# Patient Record
Sex: Female | Born: 1950 | Race: White | Hispanic: No | Smoking: Never smoker
Health system: Southern US, Community
[De-identification: ages and names within clinical notes are randomized; demographics above are authoritative.]

## PROBLEM LIST (undated history)

## (undated) DIAGNOSIS — I10 Essential (primary) hypertension: Secondary | ICD-10-CM

## (undated) DIAGNOSIS — T7840XA Allergy, unspecified, initial encounter: Secondary | ICD-10-CM

## (undated) DIAGNOSIS — F419 Anxiety disorder, unspecified: Secondary | ICD-10-CM

## (undated) HISTORY — DX: Anxiety disorder, unspecified: F41.9

## (undated) HISTORY — DX: Essential (primary) hypertension: I10

---

## 1998-10-21 HISTORY — PX: OTHER SURGICAL HISTORY: SHX169

## 1998-10-21 HISTORY — PX: TOTAL ABDOMINAL HYSTERECTOMY: SHX209

## 2006-08-19 ENCOUNTER — Ambulatory Visit: Payer: Self-pay

## 2007-02-04 ENCOUNTER — Ambulatory Visit: Payer: Self-pay | Admitting: Gastroenterology

## 2007-06-18 ENCOUNTER — Encounter: Payer: Self-pay | Admitting: Urology

## 2007-06-22 ENCOUNTER — Encounter: Payer: Self-pay | Admitting: Urology

## 2007-07-22 ENCOUNTER — Encounter: Payer: Self-pay | Admitting: Urology

## 2007-08-22 ENCOUNTER — Encounter: Payer: Self-pay | Admitting: Urology

## 2008-05-24 ENCOUNTER — Ambulatory Visit: Payer: Self-pay | Admitting: Internal Medicine

## 2008-08-31 ENCOUNTER — Ambulatory Visit: Payer: Self-pay | Admitting: Internal Medicine

## 2010-06-26 ENCOUNTER — Ambulatory Visit: Payer: Self-pay | Admitting: Internal Medicine

## 2010-06-28 ENCOUNTER — Ambulatory Visit: Payer: Self-pay | Admitting: Internal Medicine

## 2014-06-03 DIAGNOSIS — F411 Generalized anxiety disorder: Secondary | ICD-10-CM | POA: Insufficient documentation

## 2014-06-03 DIAGNOSIS — I10 Essential (primary) hypertension: Secondary | ICD-10-CM | POA: Insufficient documentation

## 2016-07-25 ENCOUNTER — Other Ambulatory Visit: Payer: Self-pay | Admitting: Family Medicine

## 2016-07-25 DIAGNOSIS — Z1231 Encounter for screening mammogram for malignant neoplasm of breast: Secondary | ICD-10-CM

## 2016-08-22 ENCOUNTER — Ambulatory Visit
Admission: RE | Admit: 2016-08-22 | Discharge: 2016-08-22 | Disposition: A | Discharge status: Home / Self Care | Payer: BC Managed Care – PPO | Source: Ambulatory Visit | Attending: Family Medicine | Admitting: Family Medicine

## 2016-08-22 DIAGNOSIS — Z1231 Encounter for screening mammogram for malignant neoplasm of breast: Secondary | ICD-10-CM | POA: Diagnosis present

## 2017-10-03 ENCOUNTER — Other Ambulatory Visit: Payer: Self-pay | Admitting: Family Medicine

## 2019-04-13 ENCOUNTER — Ambulatory Visit (INDEPENDENT_AMBULATORY_CARE_PROVIDER_SITE_OTHER): Payer: BC Managed Care – PPO

## 2019-04-13 ENCOUNTER — Other Ambulatory Visit: Payer: Self-pay

## 2019-04-13 ENCOUNTER — Ambulatory Visit: Payer: BC Managed Care – PPO | Admitting: Podiatry

## 2019-04-13 ENCOUNTER — Other Ambulatory Visit: Payer: Self-pay | Admitting: Podiatry

## 2019-04-13 ENCOUNTER — Encounter: Payer: Self-pay | Admitting: Podiatry

## 2019-04-13 VITALS — Temp 98.3°F

## 2019-04-13 DIAGNOSIS — M21619 Bunion of unspecified foot: Secondary | ICD-10-CM | POA: Diagnosis not present

## 2019-04-13 DIAGNOSIS — M21612 Bunion of left foot: Secondary | ICD-10-CM

## 2019-04-13 DIAGNOSIS — M21611 Bunion of right foot: Secondary | ICD-10-CM

## 2019-04-13 DIAGNOSIS — L84 Corns and callosities: Secondary | ICD-10-CM | POA: Diagnosis not present

## 2019-04-13 DIAGNOSIS — M779 Enthesopathy, unspecified: Secondary | ICD-10-CM

## 2019-04-15 NOTE — Progress Notes (Signed)
   Subjective: 68 year old female presenting today as a new patient with a chief complaint of bunions noted to the feet bilaterally that have been present for the past few years. She reports some discomfort from the bunions but denies any significant pain caused by them. She also reports nonpainful nodules located on the plantar aspects of the bilateral feet as well as painful callus lesions. She has not done anything to treat the symptoms. Walking and bearing weight increases the pain. Patient is here for further evaluation and treatment.  No past medical history on file.  Objective: Physical Exam General: The patient is alert and oriented x3 in no acute distress.  Dermatology: Hyperkeratotic lesions present on the bilateral feet. Pain on palpation with a central nucleated core noted. Skin is cool, dry and supple bilateral lower extremities. Negative for open lesions or macerations.  Vascular: Palpable pedal pulses bilaterally. No edema or erythema noted. Capillary refill within normal limits.  Neurological: Epicritic and protective threshold grossly intact bilaterally.   Musculoskeletal Exam: Clinical evidence of bunion deformity noted to the respective foot. There is moderate pain on palpation range of motion of the first MPJ. Lateral deviation of the hallux noted consistent with hallux abductovalgus. Palpable nodules noted to the plantar medial longitudinal arch of the bilateral feet.  Radiographic Exam: Increased intermetatarsal angle greater than 15 with a hallux abductus angle greater than 30 noted on AP view. Moderate degenerative changes noted within the first MPJ.  Assessment: 1. 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 Bilateral plantar fibromas - asymptomatic  3. Symptomatic callus lesions noted to bilateral feet x 4    Plan of Care:  1. Patient was evaluated. X-Rays reviewed. 2. Continue wearing good shoe gear.  3. Recommended good foot hygiene.  4.  Recommended OTC Motrin as needed.  5. Excisional debridement of keratotic lesion using a chisel blade was performed without incident. Light dressing applied.  6. Return to clinic as needed.    Edrick Kins, DPM [facility]  Dr. Edrick Kins, [address]                                        [place], [place] [address]                Office [phone]  Fax [phone]

## 2019-07-30 LAB — HM COLONOSCOPY

## 2021-09-06 ENCOUNTER — Other Ambulatory Visit: Payer: Self-pay | Admitting: Family Medicine

## 2021-09-06 DIAGNOSIS — Z1231 Encounter for screening mammogram for malignant neoplasm of breast: Secondary | ICD-10-CM

## 2021-12-03 ENCOUNTER — Ambulatory Visit: Payer: BC Managed Care – PPO

## 2022-02-07 ENCOUNTER — Ambulatory Visit: Payer: Self-pay

## 2022-03-19 ENCOUNTER — Ambulatory Visit
Admission: RE | Admit: 2022-03-19 | Discharge: 2022-03-19 | Disposition: A | Discharge status: Home / Self Care | Payer: Medicare PPO | Source: Ambulatory Visit | Attending: Family Medicine | Admitting: Family Medicine

## 2022-03-19 DIAGNOSIS — Z1231 Encounter for screening mammogram for malignant neoplasm of breast: Secondary | ICD-10-CM | POA: Diagnosis present

## 2022-10-04 ENCOUNTER — Other Ambulatory Visit: Payer: Self-pay | Admitting: Pulmonary Disease

## 2022-10-04 DIAGNOSIS — R0602 Shortness of breath: Secondary | ICD-10-CM

## 2022-10-04 DIAGNOSIS — R059 Cough, unspecified: Secondary | ICD-10-CM

## 2022-10-04 DIAGNOSIS — Z801 Family history of malignant neoplasm of trachea, bronchus and lung: Secondary | ICD-10-CM

## 2022-10-10 ENCOUNTER — Ambulatory Visit
Admission: RE | Admit: 2022-10-10 | Discharge: 2022-10-10 | Disposition: A | Discharge status: Home / Self Care | Payer: Medicare PPO | Source: Ambulatory Visit | Attending: Pulmonary Disease | Admitting: Pulmonary Disease

## 2022-10-10 DIAGNOSIS — R059 Cough, unspecified: Secondary | ICD-10-CM | POA: Diagnosis present

## 2022-10-10 DIAGNOSIS — R0602 Shortness of breath: Secondary | ICD-10-CM | POA: Diagnosis present

## 2022-10-10 DIAGNOSIS — Z801 Family history of malignant neoplasm of trachea, bronchus and lung: Secondary | ICD-10-CM | POA: Insufficient documentation

## 2022-10-10 MED ORDER — IOHEXOL 300 MG/ML  SOLN
75.0000 mL | Freq: Once | INTRAMUSCULAR | Status: AC | PRN
  Administered 2022-10-10: 75 mL via INTRAVENOUS

## 2023-01-28 ENCOUNTER — Encounter: Payer: Self-pay | Admitting: Family Medicine

## 2023-01-28 ENCOUNTER — Ambulatory Visit: Payer: Medicare PPO | Admitting: Family Medicine

## 2023-01-28 VITALS — BP 130/80 | HR 71 | Resp 14 | Ht 65.5 in | Wt 164.0 lb

## 2023-01-28 DIAGNOSIS — Z7689 Persons encountering health services in other specified circumstances: Secondary | ICD-10-CM

## 2023-01-28 DIAGNOSIS — I10 Essential (primary) hypertension: Secondary | ICD-10-CM | POA: Diagnosis not present

## 2023-01-28 DIAGNOSIS — F411 Generalized anxiety disorder: Secondary | ICD-10-CM | POA: Diagnosis not present

## 2023-01-28 DIAGNOSIS — M7989 Other specified soft tissue disorders: Secondary | ICD-10-CM | POA: Diagnosis not present

## 2023-01-28 NOTE — Progress Notes (Unsigned)
Subjective:    Patient ID: Brenda Caldwell, female    DOB: 02/20/1951, 72 y.o.   MRN: [id number]  ULAH NUMAN is a 72 y.o. female presenting on 01/28/2023 for Establish Care  Establish care here at [facility]  HPI  Clavicular region bilateral swelling Chronic Cough Since August 2023 She has been seen by [facility] Dr Karna Christmas, last seen 12/30/22 Prior X-ray chest, and PFTs Apt today  Anxiety On Escitalopram 10mg  daily Rarely taking Lorazepam 0.5mg  AS NEEDED  CHRONIC HTN: Reports no concerns. Current Meds - Losartan-HCTZ 100-25mg  daily   Reports good compliance, took meds today. Tolerating well, w/o complaints. Denies CP, dyspnea, HA, edema, dizziness / lightheadedness   Health Maintenance: Future pneumonia vaccine, shingles Colon CA Screening     01/28/2023    9:57 AM  Depression screen PHQ 2/9  Decreased Interest 0  Down, Depressed, Hopeless 1  PHQ - 2 Score 1  Altered sleeping 1  Tired, decreased energy 0  Change in appetite 0  Feeling bad or failure about yourself  0  Trouble concentrating 0  Moving slowly or fidgety/restless 0  Suicidal thoughts 0  PHQ-9 Score 2  Difficult doing work/chores Not difficult at all    History reviewed. No pertinent past medical history. Past Surgical History:  Procedure Laterality Date   bladder tack  2000   TOTAL ABDOMINAL HYSTERECTOMY  2000   Social History   Socioeconomic History   Marital status: Divorced    Spouse name: Not on file   Number of children: Not on file   Years of education: Not on file   Highest education level: Not on file  Occupational History   Not on file  Tobacco Use   Smoking status: Never   Smokeless tobacco: Never  Substance and Sexual Activity   Alcohol use: Yes    Alcohol/week: 1.0 standard drink of alcohol    Types: 1 Glasses of wine per week    Comment: 1 glass 3-4 nights per week   Drug use: Never   Sexual activity: Not on file  Other Topics Concern   Not on file   Social History Narrative   Not on file   Social Determinants of Health   Financial Resource Strain: Not on file  Food Insecurity: Not on file  Transportation Needs: Not on file  Physical Activity: Not on file  Stress: Not on file  Social Connections: Not on file  Intimate Partner Violence: Not on file   Family History  Problem Relation Age of Onset   Lung cancer Mother    Depression Mother    Stomach cancer Father    Bipolar disorder Brother    Current Outpatient Medications on File Prior to Visit  Medication Sig   escitalopram (LEXAPRO) 10 MG tablet Take 10 mg by mouth daily.   LORazepam (ATIVAN) 0.5 MG tablet Take 0.5 mg by mouth daily as needed for anxiety.   fluticasone (FLONASE) 50 MCG/ACT nasal spray Place 2 sprays into both nostrils daily.   losartan-hydrochlorothiazide (HYZAAR) 100-25 MG tablet Take 1 tablet by mouth daily.   omeprazole (PRILOSEC) 20 MG capsule Take 20 mg by mouth daily.   No current facility-administered medications on file prior to visit.    Review of Systems Per HPI unless specifically indicated above     Objective:    BP 130/80 (BP Location: Right Arm, Patient Position: Sitting, Cuff Size: Normal)   Pulse 71   Resp 14   Ht 5' 5.5" (1.664 m)  Wt 164 lb (74.4 kg)   BMI 26.88 kg/m   Wt Readings from Last 3 Encounters:  01/28/23 164 lb (74.4 kg)    Physical Exam Vitals and nursing note reviewed.  Constitutional:      General: She is not in acute distress.    Appearance: Normal appearance. She is well-developed. She is not diaphoretic.     Comments: Well-appearing, comfortable, cooperative  HENT:     Head: Normocephalic and atraumatic.  Eyes:     General:        Right eye: No discharge.        Left eye: No discharge.     Conjunctiva/sclera: Conjunctivae normal.  Neck:     Comments: Soft tissue edema symmetrical around superior clavicular area Cardiovascular:     Rate and Rhythm: Normal rate.  Pulmonary:     Effort: Pulmonary  effort is normal.  Skin:    General: Skin is warm and dry.     Findings: No erythema or rash.  Neurological:     Mental Status: She is alert and oriented to person, place, and time.  Psychiatric:        Mood and Affect: Mood normal.        Behavior: Behavior normal.        Thought Content: Thought content normal.     Comments: Well groomed, good eye contact, normal speech and thoughts      No results found for this or any previous visit.    Assessment & Plan:   Problem List Items Addressed This Visit     Anxiety state   Relevant Medications   escitalopram (LEXAPRO) 10 MG tablet   LORazepam (ATIVAN) 0.5 MG tablet   Benign essential hypertension - Primary   Relevant Medications   losartan-hydrochlorothiazide (HYZAAR) 100-25 MG tablet   Other Visit Diagnoses     Encounter to establish care with new doctor       Swelling of clavicular region           Establish care Return 4-6 weeks for labs  Anxiety Continue therapy Lexapro, Lorazepam AS NEEDED  HYPERTENSION Continue therapy  Notify office if need refills or running low on meds.  Clavicular/Neck Swelling - persistent Chronic Cough - now resolved Followed by [facility]m, prior imaging  No orders of the defined types were placed in this encounter.    Follow up plan: Return in about 4 weeks (around 02/25/2023) for 4-6 weeks fasting lab only then 1 week later Annual Physical.  Future labs 02/2023   Saralyn Pilar, DO [facility] 01/28/2023, 10:43 AM

## 2023-01-28 NOTE — Patient Instructions (Addendum)
Thank you for coming to the office today.   DUE for FASTING BLOOD WORK (no food or drink after midnight before the lab appointment, only water or coffee without cream/sugar on the morning of)  SCHEDULE "Lab Only" visit in the morning at the clinic for lab draw in 4-6 WEEKS   - Make sure Lab Only appointment is at about 1 week before your next appointment, so that results will be available  For Lab Results, once available within 2-3 days of blood draw, you can can log in to MyChart online to view your results and a brief explanation. Also, we can discuss results at next follow-up visit.    Please schedule a Follow-up Appointment to: Return in about 4 weeks (around 02/25/2023) for 4-6 weeks fasting lab only then 1 week later Annual Physical.  If you have any other questions or concerns, please feel free to call the office or send a message through MyChart. You may also schedule an earlier appointment if necessary.  Additionally, you may be receiving a survey about your experience at our office within a few days to 1 week by e-mail or mail. We value your feedback.  Saralyn Pilar, DO [facility], [place]

## 2023-01-29 ENCOUNTER — Other Ambulatory Visit: Payer: Self-pay | Admitting: Family Medicine

## 2023-01-29 ENCOUNTER — Encounter: Payer: Self-pay | Admitting: Family Medicine

## 2023-01-29 ENCOUNTER — Other Ambulatory Visit: Payer: Self-pay | Admitting: Pulmonary Disease

## 2023-01-29 DIAGNOSIS — Z0000 Encounter for general adult medical examination without abnormal findings: Secondary | ICD-10-CM

## 2023-01-29 DIAGNOSIS — E7800 Pure hypercholesterolemia, unspecified: Secondary | ICD-10-CM | POA: Insufficient documentation

## 2023-01-29 DIAGNOSIS — I10 Essential (primary) hypertension: Secondary | ICD-10-CM

## 2023-01-29 DIAGNOSIS — J982 Interstitial emphysema: Secondary | ICD-10-CM

## 2023-01-29 DIAGNOSIS — R7309 Other abnormal glucose: Secondary | ICD-10-CM

## 2023-02-11 ENCOUNTER — Ambulatory Visit
Admission: RE | Admit: 2023-02-11 | Discharge: 2023-02-11 | Disposition: A | Discharge status: Home / Self Care | Payer: Medicare PPO | Source: Ambulatory Visit | Attending: Pulmonary Disease | Admitting: Pulmonary Disease

## 2023-02-11 DIAGNOSIS — J982 Interstitial emphysema: Secondary | ICD-10-CM | POA: Diagnosis present

## 2023-02-11 MED ORDER — IOHEXOL 300 MG/ML  SOLN
75.0000 mL | Freq: Once | INTRAMUSCULAR | Status: AC | PRN
  Administered 2023-02-11: 75 mL via INTRAVENOUS

## 2023-02-20 ENCOUNTER — Telehealth: Payer: Self-pay | Admitting: Family Medicine

## 2023-02-20 NOTE — Telephone Encounter (Signed)
Contacted Altamese E Deblasi to schedule their annual wellness visit. Call back at later date: REQ CB 02/21/2023  Verlee Rossetti; Care Guide Ambulatory Clinical Support [place] [facility] Direct Dial: [phone]

## 2023-02-24 ENCOUNTER — Other Ambulatory Visit: Payer: Self-pay | Admitting: Pulmonary Disease

## 2023-02-24 ENCOUNTER — Encounter: Payer: Self-pay | Admitting: Family Medicine

## 2023-02-24 ENCOUNTER — Ambulatory Visit: Payer: Medicare PPO | Admitting: Family Medicine

## 2023-02-24 VITALS — BP 128/82 | HR 85 | Ht 65.5 in | Wt 166.0 lb

## 2023-02-24 DIAGNOSIS — F411 Generalized anxiety disorder: Secondary | ICD-10-CM

## 2023-02-24 DIAGNOSIS — J982 Interstitial emphysema: Secondary | ICD-10-CM

## 2023-02-24 DIAGNOSIS — R7309 Other abnormal glucose: Secondary | ICD-10-CM | POA: Diagnosis not present

## 2023-02-24 DIAGNOSIS — Z0000 Encounter for general adult medical examination without abnormal findings: Secondary | ICD-10-CM

## 2023-02-24 DIAGNOSIS — I10 Essential (primary) hypertension: Secondary | ICD-10-CM

## 2023-02-24 DIAGNOSIS — Z23 Encounter for immunization: Secondary | ICD-10-CM

## 2023-02-24 DIAGNOSIS — K219 Gastro-esophageal reflux disease without esophagitis: Secondary | ICD-10-CM

## 2023-02-24 DIAGNOSIS — E7800 Pure hypercholesterolemia, unspecified: Secondary | ICD-10-CM | POA: Diagnosis not present

## 2023-02-24 LAB — CBC WITH DIFFERENTIAL/PLATELET
Absolute Monocytes: 975 cells/uL — ABNORMAL HIGH (ref 200–950)
Basophils Absolute: 70 cells/uL (ref 0–200)
Eosinophils Relative: 2.1 %
Hemoglobin: 13.3 g/dL (ref 11.7–15.5)
MPV: 9.9 fL (ref 7.5–12.5)
Monocytes Relative: 12.5 %
Neutrophils Relative %: 52.5 %
Total Lymphocyte: 32 %

## 2023-02-24 MED ORDER — LORAZEPAM 0.5 MG PO TABS
0.5000 mg | ORAL_TABLET | Freq: Every day | ORAL | 0 refills | Status: DC | PRN
Start: 2023-02-24 — End: 2024-02-05

## 2023-02-24 MED ORDER — OMEPRAZOLE 20 MG PO CPDR: 20.0000 mg | DELAYED_RELEASE_CAPSULE | Freq: Every day | ORAL | 3 refills | Status: DC

## 2023-02-24 MED ORDER — SHINGRIX 50 MCG/0.5ML IM SUSR
INTRAMUSCULAR | 1 refills | Status: DC
Start: 2023-02-24 — End: 2023-06-12

## 2023-02-24 MED ORDER — LOSARTAN POTASSIUM-HCTZ 100-25 MG PO TABS: 1.0000 | ORAL_TABLET | Freq: Every day | ORAL | 3 refills | Status: DC

## 2023-02-24 MED ORDER — ESCITALOPRAM OXALATE 10 MG PO TABS: 10.0000 mg | ORAL_TABLET | Freq: Every day | ORAL | 3 refills | Status: DC

## 2023-02-24 NOTE — Patient Instructions (Addendum)
Thank you for coming to the office today.  Labs today  Refilled all meds  Prevnar-20 today vaccine, pneumonia  Shingles vaccine at pharmacy if you are interested x 2 doses  Okay to improve physical exercise now, but avoid activities that provoke your cough.  I believe the Prednisone is causing the weight gain  March 2023 - 148 lbs May 2023 - 151 lbs - started Lexapro July 2023 - 154 lbs  October 2023 -154 lbs Started Prednisone Nov 2023 - 158 lbs January 2024 - 160 lbs April 2024 - 164 lbs May 2024 - 166 lbs  Please schedule a Follow-up Appointment to: Return in about 6 months (around 08/27/2023) for 6 month follow-up PreDM A1c, Pulm updates.  If you have any other questions or concerns, please feel free to call the office or send a message through MyChart. You may also schedule an earlier appointment if necessary.  Additionally, you may be receiving a survey about your experience at our office within a few days to 1 week by e-mail or mail. We value your feedback.  Saralyn Pilar, DO [facility], [place]

## 2023-02-24 NOTE — Progress Notes (Signed)
Subjective:    Patient ID: Brenda Caldwell, female    DOB: 06/24/1951, 72 y.o.   MRN: [id number]  Brenda Caldwell is a 72 y.o. female presenting on 02/24/2023 for Medical Management of Chronic Issues   HPI  Here for Annual Physical and Lab Orders.  Due for fasting labs today  Medications still under prior PCP name. Would need updated rx.  Abnormal Weight Gain Concern with weight gain, unintentional Weight gain? 20+ lbs March 2023 - 148 lbs May 2023 - 151 lbs - started Lexapro July 2023 - 154 lbs  October 2023 -154 lbs Started Prednisone Nov 2023 - 158 lbs January 2024 - 160 lbs April 2024 - 164 lbs May 2024 - 166 lbs Concern with prednisone causing weight gain She has not changed diet. She is vegetarian. 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   Anxiety On Escitalopram 10mg  daily Rarely taking Lorazepam 0.5mg  AS NEEDED. Needs re order   CHRONIC HTN: Reports no concerns. Current Meds - Losartan-HCTZ 100-25mg  daily   Reports good compliance, took meds today. Tolerating well, w/o complaints. Denies CP, dyspnea, HA, edema, dizziness / lightheadedness    Health Maintenance: Request copy of last Colonoscopy 07/2019. [facility] Dr Norma Fredrickson. Next due 5 years, 07/2024.     02/24/2023    9:25 AM 01/28/2023    9:57 AM  Depression screen PHQ 2/9  Decreased Interest 0 0  Down, Depressed, Hopeless 0 1  PHQ - 2 Score 0 1  Altered sleeping 0 1  Tired, decreased energy 0 0  Change in appetite 0 0  Feeling bad or failure about yourself  0 0  Trouble concentrating 0 0  Moving slowly or fidgety/restless 0 0  Suicidal thoughts 0 0  PHQ-9 Score 0 2  Difficult doing work/chores  Not difficult at all    Social History   Tobacco Use   Smoking status: Never   Smokeless tobacco: Never   Substance Use Topics   Alcohol use: Yes    Alcohol/week: 1.0 standard drink of alcohol    Types: 1 Glasses of wine per week    Comment: 1 glass 3-4 nights per week   Drug use: Never    Review of Systems  Constitutional:  Negative for activity change, appetite change, chills, diaphoresis, fatigue and fever.  HENT:  Negative for congestion and hearing loss.   Eyes:  Negative for visual disturbance.  Respiratory:  Positive for cough. Negative for chest tightness, shortness of breath and wheezing.   Cardiovascular:  Negative for chest pain, palpitations and leg swelling.  Gastrointestinal:  Negative for abdominal pain, constipation, diarrhea, nausea and vomiting.  Genitourinary:  Negative for dysuria, frequency and hematuria.  Musculoskeletal:  Negative for arthralgias and neck pain.  Skin:  Negative for rash.  Neurological:  Negative for dizziness, weakness, light-headedness, numbness and headaches.  Hematological:  Negative for adenopathy.  Psychiatric/Behavioral:  Negative for behavioral problems, dysphoric mood and sleep disturbance.    Per HPI unless specifically indicated above     Objective:    BP 128/82   Pulse 85   Ht 5' 5.5" (1.664 m)   Wt 166 lb (75.3 kg)   SpO2 98%   BMI 27.20 kg/m   Wt Readings from Last 3 Encounters:  02/24/23 166 lb (75.3 kg)  01/28/23 164 lb (74.4 kg)  Physical Exam Vitals and nursing note reviewed.  Constitutional:      General: She is not in acute distress.    Appearance: She is well-developed. She is not diaphoretic.     Comments: Well-appearing, comfortable, cooperative  HENT:     Head: Normocephalic and atraumatic.  Eyes:     General:        Right eye: No discharge.        Left eye: No discharge.     Conjunctiva/sclera: Conjunctivae normal.     Pupils: Pupils are equal, round, and reactive to light.  Neck:     Thyroid: No thyromegaly.  Cardiovascular:     Rate and Rhythm: Normal rate and regular rhythm.     Pulses: Normal  pulses.     Heart sounds: Normal heart sounds. No murmur heard. Pulmonary:     Effort: Pulmonary effort is normal. No respiratory distress.     Breath sounds: Normal breath sounds. No wheezing or rales.  Abdominal:     General: Bowel sounds are normal. There is no distension.     Palpations: Abdomen is soft. There is no mass.     Tenderness: There is no abdominal tenderness.  Musculoskeletal:        General: No tenderness. Normal range of motion.     Cervical back: Normal range of motion and neck supple.     Right lower leg: No edema.     Left lower leg: No edema.     Comments: Upper / Lower Extremities: - Normal muscle tone, strength bilateral upper extremities 5/5, lower extremities 5/5  Lymphadenopathy:     Cervical: No cervical adenopathy.  Skin:    General: Skin is warm and dry.     Findings: No erythema or rash.  Neurological:     Mental Status: She is alert and oriented to person, place, and time.     Comments: Distal sensation intact to light touch all extremities  Psychiatric:        Mood and Affect: Mood normal.        Behavior: Behavior normal.        Thought Content: Thought content normal.     Comments: Well groomed, good eye contact, normal speech and thoughts       No results found for this or any previous visit.    Assessment & Plan:   Problem List Items Addressed This Visit     Anxiety state    Stable chronic anxiety Multiple stressors. Managed on Escitalopram 10mg  daily, since last year. Does not seem to cause weight gain Re order Lorazepam rare usage only      Relevant Medications   LORazepam (ATIVAN) 0.5 MG tablet   escitalopram (LEXAPRO) 10 MG tablet   Benign essential hypertension    Well-controlled HTN No known complications    Plan:  1. Continue current BP regimen - Losartan-HCTZ 100-25mg  daily 2. Encourage improved lifestyle - low sodium diet, regular exercise 3. Continue monitor BP outside office, bring readings to next visit, if  persistently >140/90 or new symptoms notify office sooner      Relevant Medications   losartan-hydrochlorothiazide (HYZAAR) 100-25 MG tablet   Pure hypercholesterolemia    Check lipid panel today fasting Pending result. Consider therapy options      Relevant Medications   losartan-hydrochlorothiazide (HYZAAR) 100-25 MG tablet   Other Visit Diagnoses     Need for shingles vaccine    -  Primary   Relevant Medications   SHINGRIX injection  Annual physical exam       Abnormal glucose       Need for Streptococcus pneumoniae vaccination       Relevant Orders   Pneumococcal conjugate vaccine 20-valent (Completed)   Gastroesophageal reflux disease without esophagitis       Relevant Medications   omeprazole (PRILOSEC) 20 MG capsule       Updated Health Maintenance information Fasting labs ordered today, pending results. Encouraged improvement to lifestyle with diet and exercise  Labs today    Refilled all meds  Prevnar-20 today vaccine, pneumonia  Shingles vaccine at pharmacy if you are interested x 2 doses, printed can take to pharmacy.  Continue to follow w/ Dr Oswaldo Done Pulm On the Subcutaneous Emphysema issue. Seems stable to improved based on last report. Awaiting upcoming imaging. Okay to improve physical exercise now, but avoid activities that provoke your cough.  Discussed unintentional weight gain. Most likely med side effect from Prednisone oral course for cough / pulmonology issue. See HPI, documented gain started from October 2023 to May 2024 now, with gain 10-12 lbs. She was not gaining significant wt on Lexapro alone. Unlikely to be cause Also less active, this could contribute as well.    Orders Placed This Encounter  Procedures   Pneumococcal conjugate vaccine 20-valent     Meds ordered this encounter  Medications   LORazepam (ATIVAN) 0.5 MG tablet    Sig: Take 1 tablet (0.5 mg total) by mouth daily as needed for anxiety.    Dispense:  20  tablet    Refill:  0   SHINGRIX injection    Sig: Inject 0.5 mL into muscle for shingles vaccine. Repeat dose in 2-6 months.    Dispense:  0.5 mL    Refill:  1   losartan-hydrochlorothiazide (HYZAAR) 100-25 MG tablet    Sig: Take 1 tablet by mouth daily.    Dispense:  90 tablet    Refill:  3    Changing PCP now to Dr Althea Charon. Please update rx. She is not quite ready for fill.   escitalopram (LEXAPRO) 10 MG tablet    Sig: Take 1 tablet (10 mg total) by mouth daily.    Dispense:  90 tablet    Refill:  3    Changing PCP now to Dr Althea Charon. Please update rx. She is not quite ready for fill.   omeprazole (PRILOSEC) 20 MG capsule    Sig: Take 1 capsule (20 mg total) by mouth daily before breakfast.    Dispense:  90 capsule    Refill:  3    Changing PCP now to Dr Althea Charon. Please update rx. She is not quite ready for fill.     Follow up plan: Return in about 6 months (around 08/27/2023) for 6 month follow-up PreDM A1c, Pulm updates.   Saralyn Pilar, DO [facility] 02/24/2023, 9:35 AM

## 2023-02-24 NOTE — Assessment & Plan Note (Signed)
Check lipid panel today fasting Pending result. Consider therapy options

## 2023-02-24 NOTE — Assessment & Plan Note (Signed)
Well-controlled HTN No known complications    Plan:  1. Continue current BP regimen - Losartan-HCTZ 100-25mg  daily 2. Encourage improved lifestyle - low sodium diet, regular exercise 3. Continue monitor BP outside office, bring readings to next visit, if persistently >140/90 or new symptoms notify office sooner

## 2023-02-24 NOTE — Assessment & Plan Note (Signed)
Stable chronic anxiety Multiple stressors. Managed on Escitalopram 10mg  daily, since last year. Does not seem to cause weight gain Re order Lorazepam rare usage only

## 2023-02-25 ENCOUNTER — Other Ambulatory Visit: Payer: Self-pay | Admitting: Pulmonary Disease

## 2023-02-25 DIAGNOSIS — J982 Interstitial emphysema: Secondary | ICD-10-CM

## 2023-02-25 LAB — COMPLETE METABOLIC PANEL WITH GFR
AG Ratio: 2.3 (calc) (ref 1.0–2.5)
ALT: 15 U/L (ref 6–29)
AST: 17 U/L (ref 10–35)
Albumin: 4.5 g/dL (ref 3.6–5.1)
Alkaline phosphatase (APISO): 45 U/L (ref 37–153)
BUN: 19 mg/dL (ref 7–25)
CO2: 28 mmol/L (ref 20–32)
Calcium: 9.4 mg/dL (ref 8.6–10.4)
Chloride: 98 mmol/L (ref 98–110)
Creat: 0.65 mg/dL (ref 0.50–1.05)
Globulin: 2 g/dL (calc) (ref 1.9–3.7)
Glucose, Bld: 84 mg/dL (ref 65–99)
Potassium: 4 mmol/L (ref 3.5–5.3)
Sodium: 135 mmol/L (ref 135–146)
Total Bilirubin: 0.6 mg/dL (ref 0.2–1.2)
Total Protein: 6.5 g/dL (ref 6.1–8.1)
eGFR: 95 mL/min/{1.73_m2} (ref >=60)

## 2023-02-25 LAB — LIPID PANEL
Cholesterol: 218 mg/dL — ABNORMAL HIGH (ref <200)
HDL: 70 mg/dL (ref >=50)
LDL Cholesterol (Calc): 119 mg/dL (calc) — ABNORMAL HIGH
Non-HDL Cholesterol (Calc): 148 mg/dL (calc) — ABNORMAL HIGH (ref <130)
Total CHOL/HDL Ratio: 3.1 (calc) (ref <5.0)
Triglycerides: 172 mg/dL — ABNORMAL HIGH (ref <150)

## 2023-02-25 LAB — CBC WITH DIFFERENTIAL/PLATELET
Basophils Relative: 0.9 %
Eosinophils Absolute: 164 cells/uL (ref 15–500)
HCT: 40.5 % (ref 35.0–45.0)
Lymphs Abs: 2496 cells/uL (ref 850–3900)
MCH: 28.3 pg (ref 27.0–33.0)
MCHC: 32.8 g/dL (ref 32.0–36.0)
MCV: 86.2 fL (ref 80.0–100.0)
Neutro Abs: 4095 cells/uL (ref 1500–7800)
Platelets: 242 10*3/uL (ref 140–400)
RBC: 4.7 10*6/uL (ref 3.80–5.10)
RDW: 12.7 % (ref 11.0–15.0)
WBC: 7.8 10*3/uL (ref 3.8–10.8)

## 2023-02-25 LAB — TSH: TSH: 2.12 mIU/L (ref 0.40–4.50)

## 2023-02-25 LAB — HEMOGLOBIN A1C
Hgb A1c MFr Bld: 6.1 % of total Hgb — ABNORMAL HIGH (ref <5.7)
Mean Plasma Glucose: 128 mg/dL
eAG (mmol/L): 7.1 mmol/L

## 2023-03-03 ENCOUNTER — Ambulatory Visit
Admission: RE | Admit: 2023-03-03 | Discharge: 2023-03-03 | Disposition: A | Discharge status: Home / Self Care | Payer: Medicare PPO | Source: Ambulatory Visit | Attending: Pulmonary Disease | Admitting: Pulmonary Disease

## 2023-03-03 DIAGNOSIS — J982 Interstitial emphysema: Secondary | ICD-10-CM | POA: Insufficient documentation

## 2023-03-03 MED ORDER — IOHEXOL 300 MG/ML  SOLN
75.0000 mL | Freq: Once | INTRAMUSCULAR | Status: AC | PRN
  Administered 2023-03-03: 75 mL via INTRAVENOUS

## 2023-03-05 ENCOUNTER — Ambulatory Visit
Admission: RE | Admit: 2023-03-05 | Discharge: 2023-03-05 | Disposition: A | Discharge status: Home / Self Care | Payer: Medicare PPO | Source: Ambulatory Visit | Attending: Pulmonary Disease | Admitting: Pulmonary Disease

## 2023-03-11 ENCOUNTER — Ambulatory Visit
Admission: RE | Admit: 2023-03-11 | Discharge: 2023-03-11 | Disposition: A | Discharge status: Home / Self Care | Payer: Medicare PPO | Source: Ambulatory Visit | Attending: Pulmonary Disease | Admitting: Pulmonary Disease

## 2023-03-11 ENCOUNTER — Ambulatory Visit: Admission: RE | Admit: 2023-03-11 | Payer: Medicare PPO | Source: Ambulatory Visit

## 2023-03-11 DIAGNOSIS — J982 Interstitial emphysema: Secondary | ICD-10-CM | POA: Insufficient documentation

## 2023-03-11 MED ORDER — IOHEXOL 300 MG/ML  SOLN
150.0000 mL | Freq: Once | INTRAMUSCULAR | Status: AC | PRN
  Administered 2023-03-11: 75 mL via ORAL

## 2023-05-01 ENCOUNTER — Other Ambulatory Visit: Payer: Self-pay | Admitting: Pulmonary Disease

## 2023-05-01 DIAGNOSIS — R221 Localized swelling, mass and lump, neck: Secondary | ICD-10-CM

## 2023-05-09 ENCOUNTER — Ambulatory Visit
Admission: RE | Admit: 2023-05-09 | Discharge: 2023-05-09 | Disposition: A | Discharge status: Home / Self Care | Payer: Medicare PPO | Source: Ambulatory Visit | Attending: Pulmonary Disease | Admitting: Pulmonary Disease

## 2023-05-09 DIAGNOSIS — R221 Localized swelling, mass and lump, neck: Secondary | ICD-10-CM | POA: Diagnosis not present

## 2023-05-09 MED ORDER — GADOBUTROL 1 MMOL/ML IV SOLN
7.5000 mL | Freq: Once | INTRAVENOUS | Status: AC | PRN
  Administered 2023-05-09: 7.5 mL via INTRAVENOUS

## 2023-05-29 ENCOUNTER — Ambulatory Visit (INDEPENDENT_AMBULATORY_CARE_PROVIDER_SITE_OTHER): Payer: Medicare PPO

## 2023-05-29 DIAGNOSIS — Z0000 Encounter for general adult medical examination without abnormal findings: Secondary | ICD-10-CM

## 2023-05-29 DIAGNOSIS — Z1231 Encounter for screening mammogram for malignant neoplasm of breast: Secondary | ICD-10-CM

## 2023-05-29 DIAGNOSIS — Z780 Asymptomatic menopausal state: Secondary | ICD-10-CM

## 2023-05-29 NOTE — Patient Instructions (Addendum)
Brenda Caldwell , Thank you for taking time to come for your Medicare Wellness Visit. I appreciate your ongoing commitment to your health goals. Please review the following plan we discussed and let me know if I can assist you in the future.   Referrals/Orders/Follow-Ups/Clinician Recommendations: none  This is a list of the screening recommended for you and due dates:  Health Maintenance  Topic Date Due   COVID-19 Vaccine (1) Never done   Hepatitis C Screening  Never done   DTaP/Tdap/Td vaccine (1 - Tdap) Never done   Zoster (Shingles) Vaccine (1 of 2) Never done   Colon Cancer Screening  Never done   DEXA scan (bone density measurement)  Never done   Flu Shot  05/22/2023   Mammogram  03/19/2024   Medicare Annual Wellness Visit  05/28/2024   Pneumonia Vaccine  Completed   HPV Vaccine  Aged Out    Advanced directives: (ACP Link)Information on Advanced Care Planning can be found at [facility] Advance Health Care Directives Advance Health Care Directives ([email or web address]   Next Medicare Annual Wellness Visit scheduled for next year: Yes   06/03/24 @ 9:45 am by phoner  Preventive Care 65 Years and Older, Female Preventive care refers to lifestyle choices and visits with your health care provider that can promote health and wellness. What does preventive care include? A yearly physical exam. This is also called an annual well check. Dental exams once or twice a year. Routine eye exams. Ask your health care provider how often you should have your eyes checked. Personal lifestyle choices, including: Daily care of your teeth and gums. Regular physical activity. Eating a healthy diet. Avoiding tobacco and drug use. Limiting alcohol use. Practicing safe sex. Taking low-dose aspirin every day. Taking vitamin and mineral supplements as recommended by your health care provider. What happens during an annual well check? The services and screenings done by your health care  provider during your annual well check will depend on your age, overall health, lifestyle risk factors, and family history of disease. Counseling  Your health care provider may ask you questions about your: Alcohol use. Tobacco use. Drug use. Emotional well-being. Home and relationship well-being. Sexual activity. Eating habits. History of falls. Memory and ability to understand (cognition). Work and work Astronomer. Reproductive health. Screening  You may have the following tests or measurements: Height, weight, and BMI. Blood pressure. Lipid and cholesterol levels. These may be checked every 5 years, or more frequently if you are over 11 years old. Skin check. Lung cancer screening. You may have this screening every year starting at age 15 if you have a 30-pack-year history of smoking and currently smoke or have quit within the past 15 years. Fecal occult blood test (FOBT) of the stool. You may have this test every year starting at age 18. Flexible sigmoidoscopy or colonoscopy. You may have a sigmoidoscopy every 5 years or a colonoscopy every 10 years starting at age 39. Hepatitis C blood test. Hepatitis B blood test. Sexually transmitted disease (STD) testing. Diabetes screening. This is done by checking your blood sugar (glucose) after you have not eaten for a while (fasting). You may have this done every 1-3 years. Bone density scan. This is done to screen for osteoporosis. You may have this done starting at age 57. Mammogram. This may be done every 1-2 years. Talk to your health care provider about how often you should have regular mammograms. Talk with your health care provider about your test  results, treatment options, and if necessary, the need for more tests. Vaccines  Your health care provider may recommend certain vaccines, such as: Influenza vaccine. This is recommended every year. Tetanus, diphtheria, and acellular pertussis (Tdap, Td) vaccine. You may need a Td  booster every 10 years. Zoster vaccine. You may need this after age 104. Pneumococcal 13-valent conjugate (PCV13) vaccine. One dose is recommended after age 66. Pneumococcal polysaccharide (PPSV23) vaccine. One dose is recommended after age 3. Talk to your health care provider about which screenings and vaccines you need and how often you need them. This information is not intended to replace advice given to you by your health care provider. Make sure you discuss any questions you have with your health care provider. Document Released: 11/03/2015 Document Revised: 06/26/2016 Document Reviewed: 08/08/2015 Elsevier Interactive Patient Education  2017 [employer].  Fall Prevention in the Home Falls can cause injuries. They can happen to people of all ages. There are many things you can do to make your home safe and to help prevent falls. What can I do on the outside of my home? Regularly fix the edges of walkways and driveways and fix any cracks. Remove anything that might make you trip as you walk through a door, such as a raised step or threshold. Trim any bushes or trees on the path to your home. Use bright outdoor lighting. Clear any walking paths of anything that might make someone trip, such as rocks or tools. Regularly check to see if handrails are loose or broken. Make sure that both sides of any steps have handrails. Any raised decks and porches should have guardrails on the edges. Have any leaves, snow, or ice cleared regularly. Use sand or salt on walking paths during winter. Clean up any spills in your garage right away. This includes oil or grease spills. What can I do in the bathroom? Use night lights. Install grab bars by the toilet and in the tub and shower. Do not use towel bars as grab bars. Use non-skid mats or decals in the tub or shower. If you need to sit down in the shower, use a plastic, non-slip stool. Keep the floor dry. Clean up any water that spills on the floor  as soon as it happens. Remove soap buildup in the tub or shower regularly. Attach bath mats securely with double-sided non-slip rug tape. Do not have throw rugs and other things on the floor that can make you trip. What can I do in the bedroom? Use night lights. Make sure that you have a light by your bed that is easy to reach. Do not use any sheets or blankets that are too big for your bed. They should not hang down onto the floor. Have a firm chair that has side arms. You can use this for support while you get dressed. Do not have throw rugs and other things on the floor that can make you trip. What can I do in the kitchen? Clean up any spills right away. Avoid walking on wet floors. Keep items that you use a lot in easy-to-reach places. If you need to reach something above you, use a strong step stool that has a grab bar. Keep electrical cords out of the way. Do not use floor polish or wax that makes floors slippery. If you must use wax, use non-skid floor wax. Do not have throw rugs and other things on the floor that can make you trip. What can I do with my stairs?  Do not leave any items on the stairs. Make sure that there are handrails on both sides of the stairs and use them. Fix handrails that are broken or loose. Make sure that handrails are as long as the stairways. Check any carpeting to make sure that it is firmly attached to the stairs. Fix any carpet that is loose or worn. Avoid having throw rugs at the top or bottom of the stairs. If you do have throw rugs, attach them to the floor with carpet tape. Make sure that you have a light switch at the top of the stairs and the bottom of the stairs. If you do not have them, ask someone to add them for you. What else can I do to help prevent falls? Wear shoes that: Do not have high heels. Have rubber bottoms. Are comfortable and fit you well. Are closed at the toe. Do not wear sandals. If you use a stepladder: Make sure that it is  fully opened. Do not climb a closed stepladder. Make sure that both sides of the stepladder are locked into place. Ask someone to hold it for you, if possible. Clearly mark and make sure that you can see: Any grab bars or handrails. First and last steps. Where the edge of each step is. Use tools that help you move around (mobility aids) if they are needed. These include: Canes. Walkers. Scooters. Crutches. Turn on the lights when you go into a dark area. Replace any light bulbs as soon as they burn out. Set up your furniture so you have a clear path. Avoid moving your furniture around. If any of your floors are uneven, fix them. If there are any pets around you, be aware of where they are. Review your medicines with your doctor. Some medicines can make you feel dizzy. This can increase your chance of falling. Ask your doctor what other things that you can do to help prevent falls. This information is not intended to replace advice given to you by your health care provider. Make sure you discuss any questions you have with your health care provider. Document Released: 08/03/2009 Document Revised: 03/14/2016 Document Reviewed: 11/11/2014 Elsevier Interactive Patient Education  2017 [employer].

## 2023-05-29 NOTE — Progress Notes (Signed)
Subjective:   Brenda Caldwell is a 72 y.o. female who presents for Medicare Annual (Subsequent) preventive examination.  Visit Complete: Virtual  I connected with  Brenda Caldwell on 05/29/23 by a audio enabled telemedicine application and verified that I am speaking with the correct person using two identifiers.  Patient Location: Home  Provider Location: Office/Clinic  I discussed the limitations of evaluation and management by telemedicine. The patient expressed understanding and agreed to proceed.  Vital Signs: Unable to obtain new vitals due to this being a telehealth visit.  Review of Systems     Cardiac Risk Factors include: advanced age (>92men, >37 women);hypertension     Objective:    There were no vitals filed for this visit. There is no height or weight on file to calculate BMI.     05/29/2023   10:36 AM  Advanced Directives  Does Patient Have a Medical Advance Directive? No  Would patient like information on creating a medical advance directive? No - Patient declined    Current Medications (verified) Outpatient Encounter Medications as of 05/29/2023  Medication Sig   escitalopram (LEXAPRO) 10 MG tablet Take 1 tablet (10 mg total) by mouth daily.   fluticasone (FLONASE) 50 MCG/ACT nasal spray Place 2 sprays into both nostrils daily.   losartan-hydrochlorothiazide (HYZAAR) 100-25 MG tablet Take 1 tablet by mouth daily.   omeprazole (PRILOSEC) 20 MG capsule Take 1 capsule (20 mg total) by mouth daily before breakfast.   SHINGRIX injection Inject 0.5 mL into muscle for shingles vaccine. Repeat dose in 2-6 months.   LORazepam (ATIVAN) 0.5 MG tablet Take 1 tablet (0.5 mg total) by mouth daily as needed for anxiety. (Patient not taking: Reported on 05/29/2023)   No facility-administered encounter medications on file as of 05/29/2023.    Allergies (verified) Patient has no known allergies.   History: History reviewed. No pertinent past medical history. Past Surgical  History:  Procedure Laterality Date   bladder tack  2000   TOTAL ABDOMINAL HYSTERECTOMY  2000   Family History  Problem Relation Age of Onset   Lung cancer Mother    Depression Mother    Stomach cancer Father    Bipolar disorder Brother    Social History   Socioeconomic History   Marital status: Divorced    Spouse name: Not on file   Number of children: Not on file   Years of education: Not on file   Highest education level: Not on file  Occupational History   Not on file  Tobacco Use   Smoking status: Never   Smokeless tobacco: Never  Substance and Sexual Activity   Alcohol use: Yes    Alcohol/week: 1.0 standard drink of alcohol    Types: 1 Glasses of wine per week    Comment: 1 glass 3-4 nights per week   Drug use: Never   Sexual activity: Not on file  Other Topics Concern   Not on file  Social History Narrative   Not on file   Social Determinants of Health   Financial Resource Strain: Low Risk  (05/29/2023)   Overall Financial Resource Strain (CARDIA)    Difficulty of Paying Living Expenses: Not hard at all  Food Insecurity: No Food Insecurity (05/29/2023)   Hunger Vital Sign    Worried About Running Out of Food in the Last Year: Never true    Ran Out of Food in the Last Year: Never true  Transportation Needs: No Transportation Needs (05/29/2023)   PRAPARE -  Administrator, Civil Service (Medical): No    Lack of Transportation (Non-Medical): No  Physical Activity: Sufficiently Active (05/29/2023)   Exercise Vital Sign    Days of Exercise per Week: 4 days    Minutes of Exercise per Session: 40 min  Stress: No Stress Concern Present (05/29/2023)   [employer] of Occupational Health - Occupational Stress Questionnaire    Feeling of Stress : Only a little  Social Connections: Moderately Integrated (05/29/2023)   Social Connection and Isolation Panel [NHANES]    Frequency of Communication with Friends and Family: More than three times a week    Frequency  of Social Gatherings with Friends and Family: Once a week    Attends Religious Services: Never    Database administrator or Organizations: Yes    Attends Engineer, structural: More than 4 times per year    Marital Status: Married    Tobacco Counseling Counseling given: Not Answered   Clinical Intake:  Pre-visit preparation completed: Yes  Pain : No/denies pain     Nutritional Risks: None Diabetes: No  How often do you need to have someone help you when you read instructions, pamphlets, or other written materials from your doctor or pharmacy?: 1 - Never  Interpreter Needed?: No  Information entered by :: Kennedy Bucker, LPN   Activities of Daily Living    05/29/2023   10:37 AM 02/24/2023    9:25 AM  In your present state of health, do you have any difficulty performing the following activities:  Hearing? 0 1  Vision? 0 0  Difficulty concentrating or making decisions? 0 0  Walking or climbing stairs? 1 0  Comment if a lot of stairs   Dressing or bathing? 0 0  Doing errands, shopping? 0 0  Preparing Food and eating ? N   Using the Toilet? N   In the past six months, have you accidently leaked urine? N   Do you have problems with loss of bowel control? N   Managing your Medications? N   Managing your Finances? N   Housekeeping or managing your Housekeeping? N     Patient Care Team: Smitty Cords, DO as PCP - General (Family Medicine)  Indicate any recent Medical Services you may have received from other than [facility] providers in the past year (date may be approximate).     Assessment:   This is a routine wellness examination for Brenda Caldwell.  Hearing/Vision screen Hearing Screening - Comments:: No aids Vision Screening - Comments:: Wears glasses for driving, reading- Dr.Woodard  Dietary issues and exercise activities discussed:     Goals Addressed             This Visit's Progress    DIET - EAT MORE FRUITS AND VEGETABLES          Depression Screen    05/29/2023   10:34 AM 02/24/2023    9:25 AM 01/28/2023    9:57 AM  PHQ 2/9 Scores  PHQ - 2 Score 0 0 1  PHQ- 9 Score 0 0 2    Fall Risk    05/29/2023   10:37 AM 02/24/2023    9:25 AM 01/28/2023    9:58 AM  Fall Risk   Falls in the past year? 0 0 0  Number falls in past yr: 0  0  Injury with Fall? 0  0  Risk for fall due to : No Fall Risks  No Fall Risks  Follow up  Falls prevention discussed;Falls evaluation completed  Falls evaluation completed    MEDICARE RISK AT HOME:  Medicare Risk at Home - 05/29/23 1038     Any stairs in or around the home? Yes    If so, are there any without handrails? No    Home free of loose throw rugs in walkways, pet beds, electrical cords, etc? Yes    Adequate lighting in your home to reduce risk of falls? Yes    Life alert? No    Use of a cane, walker or w/c? No    Grab bars in the bathroom? No    Shower chair or bench in shower? No    Elevated toilet seat or a handicapped toilet? No             TIMED UP AND GO:  Was the test performed?  No    Cognitive Function:        05/29/2023   10:38 AM  6CIT Screen  What Year? 0 points  What month? 0 points  What time? 0 points  Count back from 20 0 points  Months in reverse 0 points  Repeat phrase 0 points  Total Score 0 points    Immunizations Immunization History  Administered Date(s) Administered   Influenza Inj Mdck Quad Pf 07/28/2018, 07/25/2022   Influenza, High Dose Seasonal PF 08/03/2020   PNEUMOCOCCAL CONJUGATE-20 02/24/2023   Pneumococcal Polysaccharide-23 01/04/2019    TDAP status: Due, Education has been provided regarding the importance of this vaccine. Advised may receive this vaccine at local pharmacy or Health Dept. Aware to provide a copy of the vaccination record if obtained from local pharmacy or Health Dept. Verbalized acceptance and understanding.  Flu Vaccine status: Up to date  Pneumococcal vaccine status: Up to date  Covid-19 vaccine  status: Declined, Education has been provided regarding the importance of this vaccine but patient still declined. Advised may receive this vaccine at local pharmacy or Health Dept.or vaccine clinic. Aware to provide a copy of the vaccination record if obtained from local pharmacy or Health Dept. Verbalized acceptance and understanding.  Qualifies for Shingles Vaccine? Yes   Zostavax completed No   Shingrix Completed?: No.    Education has been provided regarding the importance of this vaccine. Patient has been advised to call insurance company to determine out of pocket expense if they have not yet received this vaccine. Advised may also receive vaccine at local pharmacy or Health Dept. Verbalized acceptance and understanding.  Screening Tests Health Maintenance  Topic Date Due   COVID-19 Vaccine (1) Never done   Hepatitis C Screening  Never done   DTaP/Tdap/Td (1 - Tdap) Never done   Zoster Vaccines- Shingrix (1 of 2) Never done   Colonoscopy  Never done   DEXA SCAN  Never done   INFLUENZA VACCINE  05/22/2023   MAMMOGRAM  03/19/2024   Medicare Annual Wellness (AWV)  05/28/2024   Pneumonia Vaccine 20+ Years old  Completed   HPV VACCINES  Aged Out    Health Maintenance  Health Maintenance Due  Topic Date Due   COVID-19 Vaccine (1) Never done   Hepatitis C Screening  Never done   DTaP/Tdap/Td (1 - Tdap) Never done   Zoster Vaccines- Shingrix (1 of 2) Never done   Colonoscopy  Never done   DEXA SCAN  Never done   INFLUENZA VACCINE  05/22/2023    Colorectal cancer screening: Type of screening: Colonoscopy. Completed 4 years ago. Repeat every 5 yearshad one 4  years ago, not due yet  Mammogram status: Completed 03/19/22. Repeat every year- sent referral   Bone Density status: Ordered 05/29/23. Pt provided with contact info and advised to call to schedule appt.  Lung Cancer Screening: (Low Dose CT Chest recommended if Age 41-80 years, 20 pack-year currently smoking OR have quit w/in  15years.) does not qualify.    Additional Screening:  Hepatitis C Screening: does qualify; Completed no  Vision Screening: Recommended annual ophthalmology exams for early detection of glaucoma and other disorders of the eye. Is the patient up to date with their annual eye exam?  Yes  Who is the provider or what is the name of the office in which the patient attends annual eye exams? Dr.Woodard If pt is not established with a provider, would they like to be referred to a provider to establish care? No .   Dental Screening: Recommended annual dental exams for proper oral hygiene   Community Resource Referral / Chronic Care Management: CRR required this visit?  No   CCM required this visit?  No     Plan:     I have personally reviewed and noted the following in the patient's chart:   Medical and social history Use of alcohol, tobacco or illicit drugs  Current medications and supplements including opioid prescriptions. Patient is not currently taking opioid prescriptions. Functional ability and status Nutritional status Physical activity Advanced directives List of other physicians Hospitalizations, surgeries, and ER visits in previous 12 months Vitals Screenings to include cognitive, depression, and falls Referrals and appointments  In addition, I have reviewed and discussed with patient certain preventive protocols, quality metrics, and best practice recommendations. A written personalized care plan for preventive services as well as general preventive health recommendations were provided to patient.     Hal Hope, LPN   1/0/9604   After Visit Summary: (MyChart) Due to this being a telephonic visit, the after visit summary with patients personalized plan was offered to patient via MyChart   Nurse Notes: none

## 2023-06-02 ENCOUNTER — Ambulatory Visit: Payer: Medicare PPO

## 2023-06-02 DIAGNOSIS — R933 Abnormal findings on diagnostic imaging of other parts of digestive tract: Secondary | ICD-10-CM

## 2023-06-02 DIAGNOSIS — K2289 Other specified disease of esophagus: Secondary | ICD-10-CM | POA: Diagnosis not present

## 2023-06-12 ENCOUNTER — Ambulatory Visit: Payer: Medicare PPO | Admitting: Family Medicine

## 2023-06-12 ENCOUNTER — Encounter: Payer: Self-pay | Admitting: Family Medicine

## 2023-06-12 VITALS — BP 126/78 | HR 74 | Ht 65.5 in | Wt 170.0 lb

## 2023-06-12 DIAGNOSIS — M4802 Spinal stenosis, cervical region: Secondary | ICD-10-CM | POA: Diagnosis not present

## 2023-06-12 DIAGNOSIS — M5030 Other cervical disc degeneration, unspecified cervical region: Secondary | ICD-10-CM

## 2023-06-12 NOTE — Patient Instructions (Addendum)
Thank you for coming to the office today.  Cervical Spinal Stenosis C4-C5 and C5-C6 area shows some pinching and narrowing on R side of spinal canal nerves. It can cause arthritis symptoms and stiffness and if more severe can cause pinching on nerve with numbness tingling weakness in Right arm. If this worsens, we can follow up further and consider consultation.  MR NECK SOFT TISSUE Shela Nevin WO CONTRAST [[id number]] Resulted: 05/20/23 0519  Order Status: Completed Updated: 05/20/23 0521  Narrative:    CLINICAL DATA:  Swelling at base of neck since September 2023, worse with exercise. Cough.  EXAM: MRI OF THE NECK WITH CONTRAST  TECHNIQUE: Multiplanar, multisequence MR imaging was performed following the administration of intravenous contrast.  CONTRAST:  7.3mL GADAVIST GADOBUTROL 1 MMOL/ML IV SOLN  COMPARISON:  03/03/2023  FINDINGS: Pharynx and larynx: No evidence of mass or inflammation  Salivary glands: Unremarkable.  Thyroid: Normal  Lymph nodes: None enlarged or abnormal signal.  Vascular: Unremarkable. No evidence of explanatory venous diverticulum  Limited intracranial: Unremarkable  Visualized orbits: Not covered  Mastoids and visualized paranasal sinuses: Essentially not covered  Skeleton: Degenerative disc narrowing and uncovertebral ridging causes right foraminal stenosis at C4-5 and C5-6.  Upper chest: Clear apical lungs.  IMPRESSION: Benign neck MRI.  No mass or other explanation for symptoms.   Electronically Signed   By: Tiburcio Pea M.D.   On: 05/20/2023 05:19    Please schedule a Follow-up Appointment to: Return if symptoms worsen or fail to improve.  If you have any other questions or concerns, please feel free to call the office or send a message through MyChart. You may also schedule an earlier appointment if necessary.  Additionally, you may be receiving a survey about your experience at our office within a few days to 1 week by e-mail or  mail. We value your feedback.  Saralyn Pilar, DO [facility], [place]

## 2023-06-12 NOTE — Progress Notes (Signed)
Subjective:    Patient ID: Brenda Caldwell, female    DOB: [birth year], 72 y.o.   MRN: [id number]  Brenda Caldwell is a 72 y.o. female presenting on 06/12/2023 for Follow-up   HPI  Cervical Spine DDD Spinal Stenosis  Here to follow up incidental finding on MRI Neck that was done to evaluate soft tissue swelling of neck.  Impression with: Degenerative disc narrowing and uncovertebral ridging causes right foraminal stenosis at C4-5 and C5-6   She asks about next steps if need to do anything. She does not have significant pain or symptoms.  She did have a difficulty with her Right hand before with some muscle weakness stiffness and claw but temporary only.  No additional concerns      06/12/2023    9:26 AM 05/29/2023   10:34 AM 02/24/2023    9:25 AM  Depression screen PHQ 2/9  Decreased Interest 0 0 0  Down, Depressed, Hopeless 1 0 0  PHQ - 2 Score 1 0 0  Altered sleeping 0 0 0  Tired, decreased energy 0 0 0  Change in appetite 0 0 0  Feeling bad or failure about yourself  0 0 0  Trouble concentrating 1 0 0  Moving slowly or fidgety/restless 0 0 0  Suicidal thoughts 0 0 0  PHQ-9 Score 2 0 0  Difficult doing work/chores Not difficult at all Not difficult at all     Social History   Tobacco Use   Smoking status: Never   Smokeless tobacco: Never  Substance Use Topics   Alcohol use: Yes    Alcohol/week: 1.0 standard drink of alcohol    Types: 1 Glasses of wine per week    Comment: 1 glass 3-4 nights per week   Drug use: Never    Review of Systems Per HPI unless specifically indicated above     Objective:    BP 126/78   Pulse 74   Ht 5' 5.5" (1.664 m)   Wt 170 lb (77.1 kg)   SpO2 96%   BMI 27.86 kg/m   Wt Readings from Last 3 Encounters:  06/12/23 170 lb (77.1 kg)  02/24/23 166 lb (75.3 kg)  01/28/23 164 lb (74.4 kg)    Physical Exam Vitals and nursing note reviewed.  Constitutional:      General: She is not in acute distress.    Appearance: Normal  appearance. She is well-developed. She is not diaphoretic.     Comments: Well-appearing, comfortable, cooperative  HENT:     Head: Normocephalic and atraumatic.  Eyes:     General:        Right eye: No discharge.        Left eye: No discharge.     Conjunctiva/sclera: Conjunctivae normal.  Cardiovascular:     Rate and Rhythm: Normal rate.  Pulmonary:     Effort: Pulmonary effort is normal.  Musculoskeletal:     Comments: Upper extremities R side intact full ROM and motor function strength  Skin:    General: Skin is warm and dry.     Findings: No erythema or rash.  Neurological:     General: No focal deficit present.     Mental Status: She is alert and oriented to person, place, and time.     Sensory: No sensory deficit.     Motor: No weakness.  Psychiatric:        Mood and Affect: Mood normal.        Behavior: Behavior normal.  Thought Content: Thought content normal.     Comments: Well groomed, good eye contact, normal speech and thoughts     I have personally reviewed the radiology report from 05/20/23 on MRI Neck.  MR NECK SOFT TISSUE Shela Nevin WO CONTRAST [[id number]] Resulted: 05/20/23 0519  Order Status: Completed Updated: 05/20/23 0521  Narrative:    CLINICAL DATA:  Swelling at base of neck since September 2023, worse with exercise. Cough.  EXAM: MRI OF THE NECK WITH CONTRAST  TECHNIQUE: Multiplanar, multisequence MR imaging was performed following the administration of intravenous contrast.  CONTRAST:  7.64mL GADAVIST GADOBUTROL 1 MMOL/ML IV SOLN  COMPARISON:  03/03/2023  FINDINGS: Pharynx and larynx: No evidence of mass or inflammation  Salivary glands: Unremarkable.  Thyroid: Normal  Lymph nodes: None enlarged or abnormal signal.  Vascular: Unremarkable. No evidence of explanatory venous diverticulum  Limited intracranial: Unremarkable  Visualized orbits: Not covered  Mastoids and visualized paranasal sinuses: Essentially not  covered  Skeleton: Degenerative disc narrowing and uncovertebral ridging causes right foraminal stenosis at C4-5 and C5-6.  Upper chest: Clear apical lungs.  IMPRESSION: Benign neck MRI.  No mass or other explanation for symptoms.   Electronically Signed   By: Tiburcio Pea M.D.   On: 05/20/2023 05:19    Results for orders placed or performed in visit on 02/24/23  TSH  Result Value Ref Range   TSH 2.12 0.40 - 4.50 mIU/L  Lipid panel  Result Value Ref Range   Cholesterol 218 (H) <200 mg/dL   HDL 70 > OR = 50 mg/dL   Triglycerides 409 (H) <150 mg/dL   LDL Cholesterol (Calc) 119 (H) mg/dL (calc)   Total CHOL/HDL Ratio 3.1 <5.0 (calc)   Non-HDL Cholesterol (Calc) 148 (H) <130 mg/dL (calc)  Hemoglobin W1X  Result Value Ref Range   Hgb A1c MFr Bld 6.1 (H) <5.7 % of total Hgb   Mean Plasma Glucose 128 mg/dL   eAG (mmol/L) 7.1 mmol/L  CBC with Differential/Platelet  Result Value Ref Range   WBC 7.8 3.8 - 10.8 Thousand/uL   RBC 4.70 3.80 - 5.10 Million/uL   Hemoglobin 13.3 11.7 - 15.5 g/dL   HCT 91.4 78.2 - 95.6 %   MCV 86.2 80.0 - 100.0 fL   MCH 28.3 27.0 - 33.0 pg   MCHC 32.8 32.0 - 36.0 g/dL   RDW 21.3 08.6 - 57.8 %   Platelets 242 140 - 400 Thousand/uL   MPV 9.9 7.5 - 12.5 fL   Neutro Abs 4,095 1,500 - 7,800 cells/uL   Lymphs Abs 2,496 850 - 3,900 cells/uL   Absolute Monocytes 975 (H) 200 - 950 cells/uL   Eosinophils Absolute 164 15 - 500 cells/uL   Basophils Absolute 70 0 - 200 cells/uL   Neutrophils Relative % 52.5 %   Total Lymphocyte 32.0 %   Monocytes Relative 12.5 %   Eosinophils Relative 2.1 %   Basophils Relative 0.9 %  COMPLETE METABOLIC PANEL WITH GFR  Result Value Ref Range   Glucose, Bld 84 65 - 99 mg/dL   BUN 19 7 - 25 mg/dL   Creat 4.69 6.29 - 5.28 mg/dL   eGFR 95 > OR = 60 UX/LKG/4.01U2   BUN/Creatinine Ratio SEE NOTE: 6 - 22 (calc)   Sodium 135 135 - 146 mmol/L   Potassium 4.0 3.5 - 5.3 mmol/L   Chloride 98 98 - 110 mmol/L   CO2 28 20 - 32  mmol/L   Calcium 9.4 8.6 - 10.4 mg/dL  Total Protein 6.5 6.1 - 8.1 g/dL   Albumin 4.5 3.6 - 5.1 g/dL   Globulin 2.0 1.9 - 3.7 g/dL (calc)   AG Ratio 2.3 1.0 - 2.5 (calc)   Total Bilirubin 0.6 0.2 - 1.2 mg/dL   Alkaline phosphatase (APISO) 45 37 - 153 U/L   AST 17 10 - 35 U/L   ALT 15 6 - 29 U/L      Assessment & Plan:   Problem List Items Addressed This Visit   None Visit Diagnoses     Cervical spinal stenosis    -  Primary   DDD (degenerative disc disease), cervical           Today we reviewed the radiology results on the MRI C Spine, originally ordered for neck soft tissue swelling per Pulm, negative work up. There was incidental finding of spinal stenosis. Reassurance given today. No further action required at this time.  Cervical Spinal Stenosis C4-C5 and C5-C6 area shows some pinching and narrowing on R side of spinal canal nerves. It can cause arthritis symptoms and stiffness and if more severe can cause pinching on nerve with numbness tingling weakness in Right arm.  If this worsens, we can follow up further and consider consultation.  No orders of the defined types were placed in this encounter.     Follow up plan: Return if symptoms worsen or fail to improve.   Saralyn Pilar, DO [facility] 06/12/2023, 9:43 AM

## 2023-08-01 ENCOUNTER — Emergency Department
Admission: EM | Admit: 2023-08-01 | Discharge: 2023-08-01 | Disposition: A | Discharge status: Home / Self Care | Payer: Medicare PPO | Attending: Student in an Organized Health Care Education/Training Program | Admitting: Student in an Organized Health Care Education/Training Program

## 2023-08-01 ENCOUNTER — Other Ambulatory Visit: Payer: Self-pay

## 2023-08-01 ENCOUNTER — Ambulatory Visit: Payer: Self-pay | Admitting: *Deleted

## 2023-08-01 ENCOUNTER — Emergency Department: Payer: Medicare PPO

## 2023-08-01 DIAGNOSIS — M25521 Pain in right elbow: Secondary | ICD-10-CM | POA: Diagnosis not present

## 2023-08-01 DIAGNOSIS — R55 Syncope and collapse: Secondary | ICD-10-CM

## 2023-08-01 DIAGNOSIS — S0083XA Contusion of other part of head, initial encounter: Secondary | ICD-10-CM | POA: Insufficient documentation

## 2023-08-01 DIAGNOSIS — S0990XA Unspecified injury of head, initial encounter: Secondary | ICD-10-CM | POA: Diagnosis present

## 2023-08-01 DIAGNOSIS — M25522 Pain in left elbow: Secondary | ICD-10-CM | POA: Insufficient documentation

## 2023-08-01 DIAGNOSIS — R0781 Pleurodynia: Secondary | ICD-10-CM | POA: Insufficient documentation

## 2023-08-01 DIAGNOSIS — Y92002 Bathroom of unspecified non-institutional (private) residence single-family (private) house as the place of occurrence of the external cause: Secondary | ICD-10-CM | POA: Insufficient documentation

## 2023-08-01 DIAGNOSIS — R1012 Left upper quadrant pain: Secondary | ICD-10-CM | POA: Diagnosis not present

## 2023-08-01 DIAGNOSIS — W19XXXA Unspecified fall, initial encounter: Secondary | ICD-10-CM | POA: Insufficient documentation

## 2023-08-01 LAB — CBC WITH DIFFERENTIAL/PLATELET
Abs Immature Granulocytes: 0.02 10*3/uL (ref 0.00–0.07)
Basophils Absolute: 0.1 10*3/uL (ref 0.0–0.1)
Basophils Relative: 1 %
Eosinophils Absolute: 0.5 10*3/uL (ref 0.0–0.5)
Eosinophils Relative: 6 %
HCT: 39.4 % (ref 36.0–46.0)
Hemoglobin: 13 g/dL (ref 12.0–15.0)
Immature Granulocytes: 0 %
Lymphocytes Relative: 25 %
Lymphs Abs: 2 10*3/uL (ref 0.7–4.0)
MCH: 27.7 pg (ref 26.0–34.0)
MCHC: 33 g/dL (ref 30.0–36.0)
MCV: 83.8 fL (ref 80.0–100.0)
Monocytes Absolute: 0.9 10*3/uL (ref 0.1–1.0)
Monocytes Relative: 11 %
Neutro Abs: 4.7 10*3/uL (ref 1.7–7.7)
Neutrophils Relative %: 57 %
Platelets: 276 10*3/uL (ref 150–400)
RBC: 4.7 MIL/uL (ref 3.87–5.11)
RDW: 12.5 % (ref 11.5–15.5)
WBC: 8.2 10*3/uL (ref 4.0–10.5)
nRBC: 0 % (ref 0.0–0.2)

## 2023-08-01 LAB — BASIC METABOLIC PANEL
Anion gap: 12 (ref 5–15)
BUN: 15 mg/dL (ref 8–23)
CO2: 24 mmol/L (ref 22–32)
Calcium: 9.1 mg/dL (ref 8.9–10.3)
Chloride: 94 mmol/L — ABNORMAL LOW (ref 98–111)
Creatinine, Ser: 0.58 mg/dL (ref 0.44–1.00)
GFR, Estimated: >60 mL/min (ref >60)
Glucose, Bld: 112 mg/dL — ABNORMAL HIGH (ref 70–99)
Potassium: 3.6 mmol/L (ref 3.5–5.1)
Sodium: 130 mmol/L — ABNORMAL LOW (ref 135–145)

## 2023-08-01 LAB — TROPONIN I (HIGH SENSITIVITY): Troponin I (High Sensitivity): 3 ng/L (ref <18)

## 2023-08-01 MED ORDER — IOHEXOL 300 MG/ML  SOLN
80.0000 mL | Freq: Once | INTRAMUSCULAR | Status: AC | PRN
  Administered 2023-08-01: 80 mL via INTRAVENOUS

## 2023-08-01 NOTE — ED Provider Notes (Signed)
[facility] Provider Note    Event Date/Time   First MD Initiated Contact with Patient 08/01/23 1252     (approximate)   History   Loss of Consciousness (Reports 2 unwitnessed syncopal episodes last evening, hit right side head in shower, pain to bilateral elbow and left ribs. No blood thinners. No reports cp, sob, n/v/d, fever in triage)   HPI  Brenda Caldwell is a 72 y.o. female presents the ER for evaluation of loss of consciousness that occurred last night with the patient was using the restroom.  She had gotten up to use the bathroom and after being stood up and then passed out falling hitting her head.  She then got up again does not recall passing out the second time was found under a table with a chair on top of her.  Does not know how long she was out.  She called her doctor today and was instructed to come to the ER for further evaluation.  She is also having some left upper quadrant tenderness palpation     Physical Exam   Triage Vital Signs: ED Triage Vitals  Encounter Vitals Group     BP 08/01/23 0945 (!) 159/100     Systolic BP Percentile --      Diastolic BP Percentile --      Pulse Rate 08/01/23 0945 76     Resp 08/01/23 0945 17     Temp 08/01/23 0945 98 F (36.7 C)     Temp Source 08/01/23 0945 Oral     SpO2 08/01/23 0945 98 %     Weight 08/01/23 0952 165 lb 5.5 oz (75 kg)     Height 08/01/23 0952 5\' 7"  (1.702 m)     Head Circumference --      Peak Flow --      Pain Score 08/01/23 0951 4     Pain Loc --      Pain Education --      Exclude from Growth Chart --     Most recent vital signs: Vitals:   08/01/23 1300 08/01/23 1330  BP: (!) 167/94 (!) 143/88  Pulse: 73 67  Resp:    Temp:    SpO2: 98% 96%     Constitutional: Alert  Eyes: Conjunctivae are normal.  Head: contusion to left forehead, no laceration noted. Nose: No congestion/rhinnorhea. Mouth/Throat: Mucous membranes are moist.   Neck: Painless ROM.   Cardiovascular:   Good peripheral circulation. Respiratory: Normal respiratory effort.  No retractions.  Gastrointestinal: Soft and nontender.  Musculoskeletal:  no deformity Neurologic:  MAE spontaneously. No gross focal neurologic deficits are appreciated.  Skin:  Skin is warm, dry and intact. No rash noted. Psychiatric: Mood and affect are normal. Speech and behavior are normal.    ED Results / Procedures / Treatments   Labs (all labs ordered are listed, but only abnormal results are displayed) Labs Reviewed  BASIC METABOLIC PANEL - Abnormal; Notable for the following components:      Result Value   Sodium 130 (*)    Chloride 94 (*)    Glucose, Bld 112 (*)    All other components within normal limits  CBC WITH DIFFERENTIAL/PLATELET  TROPONIN I (HIGH SENSITIVITY)  TROPONIN I (HIGH SENSITIVITY)     EKG  ED ECG REPORT I, Willy Eddy, the attending physician, personally viewed and interpreted this ECG.   Date: 08/01/2023  EKG Time: 9:50  Rate: 80  Rhythm: sinus  Axis: normal  Intervals: normal  ST&T Change: no stemi    RADIOLOGY Please see ED Course for my review and interpretation.  I personally reviewed all radiographic images ordered to evaluate for the above acute complaints and reviewed radiology reports and findings.  These findings were personally discussed with the patient.  Please see medical record for radiology report.    PROCEDURES:  Critical Care performed: No  Procedures   MEDICATIONS ORDERED IN ED: Medications  iohexol (OMNIPAQUE) 300 MG/ML solution 80 mL (80 mLs Intravenous Contrast Given 08/01/23 1359)     IMPRESSION / MDM / ASSESSMENT AND PLAN / ED COURSE  I reviewed the triage vital signs and the nursing notes.                              Differential diagnosis includes, but is not limited to, dysrhythmia, SDH, IPH, CVA, vasovagal, orthostasis, electrolyte abnormality, anemia  Patient presenting to the ER for evaluation of  symptoms as described above.  Based on symptoms, risk factors and considered above differential, this presenting complaint could reflect a potentially life-threatening illness therefore the patient will be placed on continuous pulse oximetry and telemetry for monitoring.  Laboratory evaluation will be sent to evaluate for the above complaints.      Clinical Course as of 08/01/23 1514  Fri Aug 01, 2023  1429 CT abdomen pelvis on my review and interpretation without any evidence of free fluid or displaced fracture.  Will await formal radiology report. [PR]  1457 Patient strep is negative.  Remains hemodynamically stable.  Still waiting imaging results. [PR]  1457 Was signed out oncoming physician pending follow-up imaging. [PR]    Clinical Course User Index [PR] Willy Eddy, MD     FINAL CLINICAL IMPRESSION(S) / ED DIAGNOSES   Final diagnoses:  Syncope and collapse     Rx / DC Orders   ED Discharge Orders     None        Note:  This document was prepared using Dragon voice recognition software and may include unintentional dictation errors.    Willy Eddy, MD 08/01/23 [phone]

## 2023-08-01 NOTE — Telephone Encounter (Addendum)
  Chief Complaint: Passed out twice last night suddenly.  Around midnight. Symptoms: Left rib pain, bump on right side of head that is hurting Frequency: passed out twice last night Pertinent Negatives: Patient denies chest pain or shortness of breath prior to passing out. Disposition: [x] ED /[] Urgent Care (no appt availability in office) / [] Appointment(In office/virtual)/ []  [facility] Virtual Care/ [] Home Care/ [] Refused Recommended Disposition /[] [place] [facility]/ []  Follow-up with PCP Additional Notes: I have instructed her to call 911 however she is refusing.   She did agree to call a neighbor and see if they can take her to the ED.   Not sure she will really go.   "I feel fine now".    "I just wanted to let Dr. Kirtland Bouchard. Know I passed out last night".    I called into the office and spoke with Fleet Contras letting her know the situation and that pt may not go to ED if neighbor can't take her.    Dr. Laqueta Due is out of the office today.

## 2023-08-01 NOTE — ED Provider Notes (Signed)
Emergency department handoff note  Care of this patient was signed out to me at the end of the previous provider shift.  All pertinent patient information was conveyed and all questions were answered.  Patient pending CTs of the head/neck/abdomen/pelvis did not show any evidence of acute abnormalities.  The patient has been reexamined and is ready to be discharged.  All diagnostic results have been reviewed and discussed with the patient/family.  Care plan has been outlined and the patient/family understands all current diagnoses, results, and treatment plans.  There are no new complaints, changes, or physical findings at this time.  All questions have been addressed and answered.  Patient was instructed to, and agrees to follow-up with their primary care physician as well as return to the emergency department if any new or worsening symptoms develop.   Merwyn Katos, MD 08/01/23 [phone]

## 2023-08-01 NOTE — Telephone Encounter (Signed)
Will review ED note once available.  If it turns out that she does not go to the ED, we will be sure to check in on her Monday.

## 2023-08-01 NOTE — ED Triage Notes (Signed)
Reports 2 unwitnessed syncopal episodes last evening, hit right side head in shower, pain to bilateral elbow and left ribs. No blood thinners. No reports cp, sob, n/v/d, fever in triage

## 2023-08-01 NOTE — Telephone Encounter (Signed)
Reason for Disposition  Age > 50 years  (Exception: Occurred > 1 hour ago AND now feels completely fine.)    Right side of head hurts where there is a bump.  Hit her head during the fall.   Having rib pain also.  Answer Assessment - Initial Assessment Questions 1. ONSET: "How long were you unconscious?" (minutes) "When did it happen?"     I passed out last night about midnight.   When I came to I had fallen into the tub.  I don't know how long I was out.   I got up and went out of the bathroom and next thing I know  I woke up under the table with a chair on me.  I had passed out again.   My left side hurts and I hit my head.   I didn't know where I was.   I felt something on top of me.  It was a chair.     I don't know what happened.   I passed out.   My husband called for me to crawl into the bedroom.  I crawled to the bedroom from my kitchen.   (My husband has stage 4 cancer so unable to help me).   I got into bed.   I'm ok now but   I could not sleep well after that.   I just wanted Dr. Althea Charon to know I passed out.     I feel my ribs are hurt.   The right side of my head hurts.   There is a bump on the side of my head too.    Not on blood thinners.  No shortness of breath or chest pain before passing out.   I have this swelling above my collarbone that I saw Dr. Althea Charon for.    This has been happening for the last few months.    I've had all these scans but they can't find out what is wrong with me.     I let her know she still needed to go to the ED, she was refusing, because those scans and work ups were for the collarbone swelling not for passing out plus you have hit your head and think you may have cracked some ribs.  I instructed her to call [address], not to drive since she has passed out twice suddenly.   She did not need to be driving.   She kept saying she is fine now and could drive herself but I let her know she is not fine to drive.   She refused to call 911 (Husband too sick to take her  due to stage 4 cancer).  "I don't want the ambulance coming out here".   She finally agreed to go to the ED and was going to see if her neighbor could take her.   I let her know that would be fine for her neighbor to take her and that she needed to go as soon as possible.   If neighbor can't take you to call 911 because you need to be evaluated for this passing out twice and hitting your head and ribs.  We ended the call with her going to call her neighbor.   I don't feel she will go to the ED or call 911 if her neighbor can't take her. Message sent to Dr. Althea Charon.     2. CONTENT: "What happened during period of unconsciousness?" (e.g., seizure activity)      I don't know.  I just came to laying in the bottom of the tub.   The next time I passed out I was under my kitchen table with a chair on top of me. 3. MENTAL STATUS: "Alert and oriented now?" (oriented x 3 = name, month, location)      Yes, alert and oriented.  Speech is very fast but appropriate.  4. TRIGGER: "What do you think caused the fainting?" "What were you doing just before you fainted?"  (e.g., exercise, sudden standing up, prolonged standing)     I don't know.   Have no idea 5. RECURRENT SYMPTOM: "Have you ever passed out before?" If Yes, ask: "When was the last time?" and "What happened that time?"      Not asked    6. INJURY: "Did you sustain any injury during the fall?"      Yes  My ribs on my left side hurt and I hit the right side of my head.   There is a bump there. 7. CARDIAC SYMPTOMS: "Have you had any of the following symptoms: chest pain, difficulty breathing, palpitations?"     No chest pain or shortness of breath prior to passing out.    8. NEUROLOGIC SYMPTOMS: "Have you had any of the following symptoms: headache, numbness, vertigo, weakness?"     No    The right side of her head is hurting now where there is a bump.   Not on blood thinners. 9. GI SYMPTOMS: "Have you had any of the following symptoms: abdomen  pain, vomiting, diarrhea, blood in stools?"     Not asked 10. OTHER SYMPTOMS: "Do you have any other symptoms?"       See above 11. PREGNANCY: "Is there any chance you are pregnant?" "When was your last menstrual period?"       N/A due to age  Protocols used: Fainting-A-AH

## 2023-08-04 ENCOUNTER — Encounter: Payer: Self-pay | Admitting: Family Medicine

## 2023-08-04 NOTE — Telephone Encounter (Signed)
ED note from 10/11 was reviewed

## 2023-08-07 ENCOUNTER — Ambulatory Visit: Payer: Medicare PPO

## 2023-08-07 ENCOUNTER — Ambulatory Visit: Payer: Medicare PPO | Attending: Cardiology | Admitting: Cardiology

## 2023-08-07 ENCOUNTER — Encounter: Payer: Self-pay | Admitting: Cardiology

## 2023-08-07 ENCOUNTER — Ambulatory Visit: Payer: Self-pay

## 2023-08-07 VITALS — BP 152/100 | HR 70 | Ht 66.5 in | Wt 169.2 lb

## 2023-08-07 DIAGNOSIS — R55 Syncope and collapse: Secondary | ICD-10-CM

## 2023-08-07 DIAGNOSIS — I10 Essential (primary) hypertension: Secondary | ICD-10-CM | POA: Diagnosis not present

## 2023-08-07 DIAGNOSIS — S301XXS Contusion of abdominal wall, sequela: Secondary | ICD-10-CM | POA: Diagnosis not present

## 2023-08-07 NOTE — Telephone Encounter (Signed)
Left message for patient to call back to discuss results.  OK for [facility] to give results.

## 2023-08-07 NOTE — Telephone Encounter (Signed)
     Chief Complaint: Hurt left ribs after fall last week. Pain 8/10. Tylenol "helps a little, wakes me up at night." Has appointment Monday. Asking what else can she take for pain. Symptoms: Pain, bruising Frequency: Last week Pertinent Negatives: Patient denies SOB Disposition: [] ED /[] Urgent Care (no appt availability in office) / [] Appointment(In office/virtual)/ []  [place] [facility]/ [] Home Care/ [] Refused Recommended Disposition /[] [place] [facility]/ [x]  Follow-up with PCP Additional Notes: Please advise pt.  Reason for Disposition  [1] MODERATE pain (e.g., interferes with normal activities) AND [2] high-risk adult (e.g., age > 60 years, osteoporosis, chronic steroid use)  Answer Assessment - Initial Assessment Questions 1. MECHANISM: "How did the injury happen?"     Fainted and fell 2. ONSET: "When did the injury happen?" (Minutes or hours ago)     Last week 3. LOCATION: "Where on the chest is the injury located?"     Left ribs 4. APPEARANCE: "What does the injury look like?"     Bruised 5. BLEEDING: "Is there any bleeding now? If Yes, ask: How long has it been bleeding?"     No 6. SEVERITY: "Any difficulty with breathing?"     No 7. SIZE: For cuts, bruises, or swelling, ask: "How large is it?" (e.g., inches or centimeters)     N/a 8. PAIN: "Is there pain?" If Yes, ask: "How bad is the pain?"   (e.g., Scale 1-10; or mild, moderate, severe)     8 9. TETANUS: For any breaks in the skin, ask: "When was the last tetanus booster?"     N/a 10. PREGNANCY: "Is there any chance you are pregnant?" "When was your last menstrual period?"       No  Protocols used: Chest Injury-A-AH

## 2023-08-07 NOTE — Progress Notes (Signed)
Cardiology Office Note:    Date:  08/07/2023   ID:  LAURENCE FOLZ, DOB [birth year], MRN [id number]  PCP:  Smitty Cords, DO   [facility] Providers Cardiologist:  None     Referring MD: Saralyn Pilar *   Chief Complaint  Patient presents with   New Patient (Initial Visit)    Patient presented to ED on 08/01/23 with syncope episode with loss of consciousness .  No further episodes.  Seen by cardiology over 25 years ago.  Orthostatic bp obtained today.    History of Present Illness:    Brenda Caldwell is a 72 y.o. female with a hx of hypertension, presenting due to syncope.  Patient was in the bathroom urinating last week, felt nauseous upon standing and suddenly passed out.  Denies chest pain, palpitations.  She fell into her bathtub, bruising to left side of her abdomen.  She crawled to her bed few moments later.  Reported incident to her PCPs office the next day and was recommended to go to the ED.  Workup in the ED with head an neck CT, abdominal CT were unrevealing.  Denies having any episodes since.  States having an episode of dizziness about 3 years ago while walking and almost passed out.  She sat down with improvement in symptoms.  Past Medical History:  Diagnosis Date   Anxiety    Hypertension     Past Surgical History:  Procedure Laterality Date   bladder tack  2000   TOTAL ABDOMINAL HYSTERECTOMY  2000    Current Medications: Current Meds  Medication Sig   escitalopram (LEXAPRO) 10 MG tablet Take 1 tablet (10 mg total) by mouth daily.   fluticasone (FLONASE) 50 MCG/ACT nasal spray Place 2 sprays into both nostrils daily.   LORazepam (ATIVAN) 0.5 MG tablet Take 1 tablet (0.5 mg total) by mouth daily as needed for anxiety.   losartan-hydrochlorothiazide (HYZAAR) 100-25 MG tablet Take 1 tablet by mouth daily.   omeprazole (PRILOSEC) 20 MG capsule Take 1 capsule (20 mg total) by mouth daily before breakfast.     Allergies:   Patient has  no known allergies.   Social History   Socioeconomic History   Marital status: Divorced    Spouse name: Not on file   Number of children: Not on file   Years of education: Not on file   Highest education level: Not on file  Occupational History   Not on file  Tobacco Use   Smoking status: Never   Smokeless tobacco: Never  Vaping Use   Vaping status: Never Used  Substance and Sexual Activity   Alcohol use: Yes    Alcohol/week: 1.0 standard drink of alcohol    Types: 1 Glasses of wine per week    Comment: 1 glass 3-4 nights per week   Drug use: Never   Sexual activity: Not on file  Other Topics Concern   Not on file  Social History Narrative   Not on file   Social Determinants of Health   Financial Resource Strain: Low Risk  (05/29/2023)   Overall Financial Resource Strain (CARDIA)    Difficulty of Paying Living Expenses: Not hard at all  Food Insecurity: No Food Insecurity (05/29/2023)   Hunger Vital Sign    Worried About Running Out of Food in the Last Year: Never true    Ran Out of Food in the Last Year: Never true  Transportation Needs: No Transportation Needs (05/29/2023)   PRAPARE - Transportation  Lack of Transportation (Medical): No    Lack of Transportation (Non-Medical): No  Physical Activity: Sufficiently Active (05/29/2023)   Exercise Vital Sign    Days of Exercise per Week: 4 days    Minutes of Exercise per Session: 40 min  Stress: No Stress Concern Present (05/29/2023)   [employer] of Occupational Health - Occupational Stress Questionnaire    Feeling of Stress : Only a little  Social Connections: Moderately Integrated (05/29/2023)   Social Connection and Isolation Panel [NHANES]    Frequency of Communication with Friends and Family: More than three times a week    Frequency of Social Gatherings with Friends and Family: Once a week    Attends Religious Services: Never    Database administrator or Organizations: Yes    Attends Hospital doctor: More than 4 times per year    Marital Status: Married     Family History: The patient's family history includes Bipolar disorder in her brother; Depression in her mother; Heart disease in her maternal grandmother; Lung cancer in her mother; Stomach cancer in her father.  ROS:   Please see the history of present illness.     All other systems reviewed and are negative.  EKGs/Labs/Other Studies Reviewed:    The following studies were reviewed today:  EKG Interpretation Date/Time:  Thursday August 07 2023 09:33:58 EDT Ventricular Rate:  70 PR Interval:  150 QRS Duration:  80 QT Interval:  408 QTC Calculation: 440 R Axis:   4  Text Interpretation: Sinus rhythm with Premature atrial complexes Confirmed by Debbe Odea ([id number]) on 08/07/2023 9:40:53 AM    Recent Labs: 02/24/2023: ALT 15; TSH 2.12 08/01/2023: BUN 15; Creatinine, Ser 0.58; Hemoglobin 13.0; Platelets 276; Potassium 3.6; Sodium 130  Recent Lipid Panel    Component Value Date/Time   CHOL 218 (H) 02/24/2023 1022   TRIG 172 (H) 02/24/2023 1022   HDL 70 02/24/2023 1022   CHOLHDL 3.1 02/24/2023 1022   LDLCALC 119 (H) 02/24/2023 1022     Risk Assessment/Calculations:     HYPERTENSION CONTROL Vitals:   08/07/23 0928 08/07/23 0938  BP: (!) 142/94 (!) 152/100    The patient's blood pressure is elevated above target today.  In order to address the patient's elevated BP: Blood pressure will be monitored at home to determine if medication changes need to be made.           Physical Exam:    VS:  BP (!) 152/100 (BP Location: Right Arm, Patient Position: Sitting, Cuff Size: Normal)   Pulse 70   Ht 5' 6.5" (1.689 m)   Wt 169 lb 3.2 oz (76.7 kg)   SpO2 98%   BMI 26.90 kg/m     Wt Readings from Last 3 Encounters:  08/07/23 169 lb 3.2 oz (76.7 kg)  08/01/23 165 lb 5.5 oz (75 kg)  06/12/23 170 lb (77.1 kg)     GEN:  Well nourished, well developed in no acute distress HEENT: Normal NECK: No  JVD; No carotid bruits CARDIAC: RRR, no murmurs, rubs, gallops RESPIRATORY:  Clear to auscultation without rales, wheezing or rhonchi  ABDOMEN: Soft, non-tender, bruising noted on the left lower abdominal area, mild tenderness with palpation MUSCULOSKELETAL:  No edema;  SKIN: Warm and dry NEUROLOGIC:  Alert and oriented x 3 PSYCHIATRIC:  Normal affect   ASSESSMENT:    1. Syncope and collapse   2. Primary hypertension   3. Contusion of abdominal wall, sequela    PLAN:  In order of problems listed above:  Syncope, appears vasovagal with symptoms of nausea prior to passing out.  History of dizziness, presyncope about 2 to 3 years ago.  Orthostatic vitals with no evidence for orthostasis.  Obtain echo, obtain carotid ultrasound, place cardiac monitor to evaluate cardiac etiology for syncope. Hypertension, BP elevated, usually controlled.  Monitor for now.  Continue losartan 100, HCTZ 25.  Abdominal discomfort likely contributing to elevated BP. Abdominal contusion/pain from recent fall.  Follow-up with PCP regarding this.  CT abdomen 08/01/2023 was unrevealing.  Follow-up after cardiac testing      Medication Adjustments/Labs and Tests Ordered: Current medicines are reviewed at length with the patient today.  Concerns regarding medicines are outlined above.  Orders Placed This Encounter  Procedures   LONG TERM MONITOR (3-14 DAYS)   EKG 12-Lead   ECHOCARDIOGRAM COMPLETE   VAS US CAROTID   No orders of the defined types were placed in this encounter.   Patient Instructions  Medication Instructions:  Your Physician recommend you continue on your current medication as directed.    *If you need a refill on your cardiac medications before your next appointment, please call your pharmacy*   Lab Work: None ordered today.  If you have labs (blood work) drawn today and your tests are completely normal, you will receive your results only by: MyChart Message (if you have MyChart)  OR A paper copy in the mail If you have any lab test that is abnormal or we need to change your treatment, we will call you to review the results.   Testing/Procedures: Your physician has requested that you have an echocardiogram. Echocardiography is a painless test that uses sound waves to create images of your heart. It provides your doctor with information about the size and shape of your heart and how well your heart's chambers and valves are working.   You may receive an ultrasound enhancing agent through an IV if needed to better visualize your heart during the echo. This procedure takes approximately one hour.  There are no restrictions for this procedure.  This will take place at [address] ([facility], [place] [address]   Your physician has requested that you have a carotid duplex. This test is an ultrasound of the carotid arteries in your neck. It looks at blood flow through these arteries that supply the brain with blood.   Allow one hour for this exam.  There are no restrictions or special instructions.  This will take place at [address] ([facility], [place] [address]   Heart Monitor:  Your physician has requested you wear a ZIO monitor for 14  days.  Your monitor will be mailed to your home address within 3-5 business days. This is sent via Fed Ex from [employer]. However, if you have not received your monitor after 5 business days please send [place] a MyChart message or call the office at [phone], so we may follow up on this for you.   This monitor is a medical device (single patch monitor) that records the heart's electrical activity. Doctors most often use these monitors to diagnose arrhythmias. Arrhythmias are problems with the speed or rhythm of the heartbeat.   [employer] supplies 1 patch per enrollment. Additional stickers are not available.  Please DO NOT apply the patch if you will be having a Nuclear  Stress Test, Echocardiogram, Cardiac CT, Cardiac MRI, Chest X-ray during the period you would be wearing the  monitor. The patch cannot be worn during these tests.  You cannot remove and re-apply the ZIO patch monitor.   Applying the Monitor: Once you receive your monitor, this will include a small razor, abrader, and 4 alcohol pads. Shave hair from upper left chest Rub abrader disc in 40 strokes over the left upper chest as indicated in your monitor instructions Clean area with 4 enclosed alcohol pads (there may be a mild & brief stinging sensation over the newly abraded area, but this is normal). Let dry Apply patch as indicated in monitor instructions. Patch will be placed under collarbone on the left side of the chest with arrow pointing upward. Rub adhesive wings for 2 minutes. Remove white label marked "1". Remove the white label marked "2". Rub patch adhesive wings for an 2 minutes.  While looking in a mirror, press and release button in the center of the patch. You may hear a "click". A small green light will flash 4-6 times and then stop. This will be your indicator that the monitor has been turned on.  Wearing the Monitor: Avoid showering during the first 24 hours of wearing the monitor.  After 24 hours you may shower with the patch on. Take brief showers with your back facing the shower head.  Avoid excessive sweating to help maximize wear time. Do not submerge the device, no hot tubs, and no swimming pools. Keep any lotions or oils away from the patch. Press the button if you feel a symptom. You will hear a small click. Record date, time, and symptoms in the Patient Logbook or App.  Monitor Issues: Call [employer] Customer Care at [phone] if you have questions regarding your Zio Patch Monitor. Call them immediately if you see an orange/ amber colored light blinking on your monitor. If your monitor falls off and you cannot get this reapplied or if you need  suggestions for securing your monitor call [employer] at [phone].   Returning the Monitor: Once you have completed wearing your monitor, follow instructions on the last 2 pages of the Patient Logbook. Stick monitor patch on to the last page of the Patient Logbook.  Place Patient Logbook with monitor in the return box provided. Use locking tab on box and tape box closed securely. The return box has pre-paid postage on it.  Place the return box in the regular [place] Mail box as soon as possible It will take anywhere from 1-2 weeks for your provider to receive and review your results once you mail this back. If for some reason you have misplaced your return box then call our office and we can provide another box and/or mail it off for you.   Billing  and Patient Assistance Program Information: We have supplied [employer] with any of your insurance information on file for billing purposes. [employer] offers a sliding scale Patient Assistance Program for patients that do not have insurance, or whose insurance does not completely cover the cost of the ZIO monitor. You must apply for the Patient Assistance Program to qualify for this discounted rate. To apply, please call [employer] at [phone], select option 1, ask to apply for the Patient Assistance Program. [employer] will ask your household income, and how many people are in your household. They will quote your out-of-pocket cost based on that information. [employer] will also be able to set up for a 61-month, interest-free payment plan if needed.      Follow-Up: At [facility], you and your health needs are our  priority.  As part of our continuing mission to provide you with exceptional heart care, we have created designated Provider Care Teams.  These Care Teams include your primary Cardiologist (physician) and Advanced Practice Providers (APPs -  Physician Assistants and Nurse Practitioners) who all work together to provide you with the care you  need, when you need it.  We recommend signing up for the patient portal called "MyChart".  Sign up information is provided on this After Visit Summary.  MyChart is used to connect with patients for Virtual Visits (Telemedicine).  Patients are able to view lab/test results, encounter notes, upcoming appointments, etc.  Non-urgent messages can be sent to your provider as well.   To learn more about what you can do with MyChart, go to [email or web address].    Your next appointment:   2 month(s)  Provider:   You may see Dr. Azucena Cecil or one of the following Advanced Practice Providers on your designated Care Team:   Nicolasa Ducking, NP Eula Listen, PA-C Cadence Fransico Michael, PA-C Charlsie Quest, NP        Signed, Debbe Odea, MD  08/07/2023 10:20 AM    [facility]

## 2023-08-07 NOTE — Telephone Encounter (Signed)
I reviewed her ED visit from 08/01/23. It does not appear that the CT head Neck or abdomen images showed any actual fracture or other injury. It seems she has contusions and bruising from the fall injury. I don't see that the ED did a dedicated rib series x-ray or CT.  She is calling to ask what else to take for pain before her apt with me for ED Follow-up on Monday. The triage note says that Tylenol helps some.  My OTC pain recommendations are as follows  Tylenol Ext Strength 500mg  x 2 = 1000mg  per dose, up to 3 times per day or every 6-8 hours.  She can also choose 1 NSAID to add:  Ibuprofen 600mg  with meal 3 times a day OR Aleve 220mg  x 2 tab = 440mg  twice a day with meal.  She should alternate dosing so it is not all at the same time.  Let me know if questions - I would discuss Rx medications with her on Monday if OTC is not resolving her pain.  Saralyn Pilar, DO [facility] 08/07/2023, 3:23 PM

## 2023-08-07 NOTE — Patient Instructions (Signed)
Medication Instructions:  Your Physician recommend you continue on your current medication as directed.    *If you need a refill on your cardiac medications before your next appointment, please call your pharmacy*   Lab Work: None ordered today.  If you have labs (blood work) drawn today and your tests are completely normal, you will receive your results only by: MyChart Message (if you have MyChart) OR A paper copy in the mail If you have any lab test that is abnormal or we need to change your treatment, we will call you to review the results.   Testing/Procedures: Your physician has requested that you have an echocardiogram. Echocardiography is a painless test that uses sound waves to create images of your heart. It provides your doctor with information about the size and shape of your heart and how well your heart's chambers and valves are working.   You may receive an ultrasound enhancing agent through an IV if needed to better visualize your heart during the echo. This procedure takes approximately one hour.  There are no restrictions for this procedure.  This will take place at [address] ([facility] [address], [place] [address]   Your physician has requested that you have a carotid duplex. This test is an ultrasound of the carotid arteries in your neck. It looks at blood flow through these arteries that supply the brain with blood.   Allow one hour for this exam.  There are no restrictions or special instructions.  This will take place at [address] ([facility] [address], [place] [address]   Heart Monitor:  Your physician has requested you wear a ZIO monitor for 14  days.  Your monitor will be mailed to your home address within 3-5 business days. This is sent via Fed Ex from [employer]. However, if you have not received your monitor after 5 business days please send [place] a MyChart message or call the office at [phone], so we may  follow up on this for you.   This monitor is a medical device (single patch monitor) that records the heart's electrical activity. Doctors most often use these monitors to diagnose arrhythmias. Arrhythmias are problems with the speed or rhythm of the heartbeat.   [employer] supplies 1 patch per enrollment. Additional stickers are not available.  Please DO NOT apply the patch if you will be having a Nuclear Stress Test, Echocardiogram, Cardiac CT, Cardiac MRI, Chest X-ray during the period you would be wearing the monitor. The patch cannot be worn during these tests.  You cannot remove and re-apply the ZIO patch monitor.   Applying the Monitor: Once you receive your monitor, this will include a small razor, abrader, and 4 alcohol pads. Shave hair from upper left chest Rub abrader disc in 40 strokes over the left upper chest as indicated in your monitor instructions Clean area with 4 enclosed alcohol pads (there may be a mild & brief stinging sensation over the newly abraded area, but this is normal). Let dry Apply patch as indicated in monitor instructions. Patch will be placed under collarbone on the left side of the chest with arrow pointing upward. Rub adhesive wings for 2 minutes. Remove white label marked "1". Remove the white label marked "2". Rub patch adhesive wings for an 2 minutes.  While looking in a mirror, press and release button in the center of the patch. You may hear a "click". A small green light will flash 4-6 times and then  stop. This will be your indicator that the monitor has been turned on.  Wearing the Monitor: Avoid showering during the first 24 hours of wearing the monitor.  After 24 hours you may shower with the patch on. Take brief showers with your back facing the shower head.  Avoid excessive sweating to help maximize wear time. Do not submerge the device, no hot tubs, and no swimming pools. Keep any lotions or oils away from the patch. Press the button if you feel a  symptom. You will hear a small click. Record date, time, and symptoms in the Patient Logbook or App.  Monitor Issues: Call [employer] Customer Care at [phone] if you have questions regarding your Zio Patch Monitor. Call them immediately if you see an orange/ amber colored light blinking on your monitor. If your monitor falls off and you cannot get this reapplied or if you need suggestions for securing your monitor call [employer] at [phone].   Returning the Monitor: Once you have completed wearing your monitor, follow instructions on the last 2 pages of the Patient Logbook. Stick monitor patch on to the last page of the Patient Logbook.  Place Patient Logbook with monitor in the return box provided. Use locking tab on box and tape box closed securely. The return box has pre-paid postage on it.  Place the return box in the regular [place] Mail box as soon as possible It will take anywhere from 1-2 weeks for your provider to receive and review your results once you mail this back. If for some reason you have misplaced your return box then call our office and we can provide another box and/or mail it off for you.   Billing  and Patient Assistance Program Information: We have supplied [employer] with any of your insurance information on file for billing purposes. [employer] offers a sliding scale Patient Assistance Program for patients that do not have insurance, or whose insurance does not completely cover the cost of the ZIO monitor. You must apply for the Patient Assistance Program to qualify for this discounted rate. To apply, please call [employer] at [phone], select option 1, ask to apply for the Patient Assistance Program. [employer] will ask your household income, and how many people are in your household. They will quote your out-of-pocket cost based on that information. [employer] will also be able to set up for a 96-month, interest-free payment plan if needed.      Follow-Up: At  [facility], you and your health needs are our priority.  As part of our continuing mission to provide you with exceptional heart care, we have created designated Provider Care Teams.  These Care Teams include your primary Cardiologist (physician) and Advanced Practice Providers (APPs -  Physician Assistants and Nurse Practitioners) who all work together to provide you with the care you need, when you need it.  We recommend signing up for the patient portal called "MyChart".  Sign up information is provided on this After Visit Summary.  MyChart is used to connect with patients for Virtual Visits (Telemedicine).  Patients are able to view lab/test results, encounter notes, upcoming appointments, etc.  Non-urgent messages can be sent to your provider as well.   To learn more about what you can do with MyChart, go to [email or web address].    Your next appointment:   2 month(s)  Provider:   You may see Dr. Azucena Cecil or one of the following Advanced Practice Providers on your designated Care Team:   Nicolasa Ducking, NP  Eula Listen, PA-C Cadence Furth, PA-C Charlsie Quest, NP

## 2023-08-08 NOTE — Telephone Encounter (Signed)
Left message for patient to call back to discuss results.  OK for [facility] to give results.

## 2023-08-10 DIAGNOSIS — R55 Syncope and collapse: Secondary | ICD-10-CM

## 2023-08-11 ENCOUNTER — Ambulatory Visit
Admission: RE | Admit: 2023-08-11 | Discharge: 2023-08-11 | Disposition: A | Discharge status: Home / Self Care | Payer: Medicare PPO | Source: Ambulatory Visit | Attending: Family Medicine | Admitting: Family Medicine

## 2023-08-11 ENCOUNTER — Ambulatory Visit
Admission: RE | Admit: 2023-08-11 | Discharge: 2023-08-11 | Disposition: A | Discharge status: Home / Self Care | Payer: Medicare PPO | Attending: Family Medicine | Admitting: Family Medicine

## 2023-08-11 ENCOUNTER — Encounter: Payer: Self-pay | Admitting: Family Medicine

## 2023-08-11 ENCOUNTER — Ambulatory Visit: Payer: Medicare PPO | Admitting: Family Medicine

## 2023-08-11 VITALS — BP 140/84 | HR 73 | Ht 66.5 in | Wt 167.0 lb

## 2023-08-11 DIAGNOSIS — R0781 Pleurodynia: Secondary | ICD-10-CM | POA: Diagnosis present

## 2023-08-11 DIAGNOSIS — R55 Syncope and collapse: Secondary | ICD-10-CM

## 2023-08-11 DIAGNOSIS — I10 Essential (primary) hypertension: Secondary | ICD-10-CM | POA: Diagnosis not present

## 2023-08-11 MED ORDER — AMLODIPINE BESYLATE 10 MG PO TABS
10.0000 mg | ORAL_TABLET | Freq: Every day | ORAL | 1 refills | Status: DC
Start: 2023-08-11 — End: 2023-10-07

## 2023-08-11 MED ORDER — TRAMADOL HCL 50 MG PO TABS
50.0000 mg | ORAL_TABLET | Freq: Four times a day (QID) | ORAL | 0 refills | Status: AC | PRN
Start: 2023-08-11 — End: 2023-08-16

## 2023-08-11 MED ORDER — VALSARTAN 160 MG PO TABS
160.0000 mg | ORAL_TABLET | Freq: Every day | ORAL | 1 refills | Status: DC
Start: 2023-08-11 — End: 2023-10-07

## 2023-08-11 NOTE — Progress Notes (Signed)
Subjective:    Patient ID: Brenda Caldwell, female    DOB: Dec 30, 1950, 72 y.o.   MRN: [id number]  Brenda Caldwell is a 72 y.o. female presenting on 08/11/2023 for Hospitalization Follow-up (Continues with rib pain and headache)   HPI  Discussed the use of AI scribe software for clinical note transcription with the patient, who gave verbal consent to proceed.      ED FOLLOW-UP VISIT  Hospital/Location: [facility] Date of ED Visit: 08/01/23  Reason for Presenting to ED: Syncope  FOLLOW-UP  - ED provider note and record have been reviewed - Patient presents today about 10 days after recent ED visit. Brief summary of recent course, patient had symptoms of unprovoked syncope, she did not recall why but she got up overnight and used the bathroom and then had sensation that prompted her to spit in the sink and had syncope and she fell and hit her face/head, and again she had syncope episode again and fell down, and woke up later and crawled back to bed. She called the office in AM and was advised to get evaluated by the ED, with lab panel BMET CBC troponin,   Additionally she had LEFT sided rib pain mid to lower lateral, she  - She has history of vasovagal syncope episode  - Today reports overall has done well after discharge from ED. Symptoms of syncope have resolved, no further repeat occurrences.  She saw Dr Azucena Cecil [facility] on 10/17, he believes vasovagal syncope as well is most likely, she has ZIO patch monitor and scheduling ECOH and Carotid US. Pending results.  She just started ZIO patch yesterday  The patient is currently on a regimen of losartan-hydrochlorothiazide for blood pressure management. She reports frequent urination, which she believes may be due to her current medication. She also expresses concerns about potential dehydration.  - New medications on discharge: None - Changes to current meds on discharge: None  I have reviewed the discharge medication list, and  have reconciled the current and discharge medications today.    Current Outpatient Medications:    amLODipine (NORVASC) 10 MG tablet, Take 1 tablet (10 mg total) by mouth daily., Disp: 90 tablet, Rfl: 1   escitalopram (LEXAPRO) 10 MG tablet, Take 1 tablet (10 mg total) by mouth daily., Disp: 90 tablet, Rfl: 3   fluticasone (FLONASE) 50 MCG/ACT nasal spray, Place 2 sprays into both nostrils daily., Disp: , Rfl:    LORazepam (ATIVAN) 0.5 MG tablet, Take 1 tablet (0.5 mg total) by mouth daily as needed for anxiety., Disp: 20 tablet, Rfl: 0   omeprazole (PRILOSEC) 20 MG capsule, Take 1 capsule (20 mg total) by mouth daily before breakfast., Disp: 90 capsule, Rfl: 3   traMADol (ULTRAM) 50 MG tablet, Take 1 tablet (50 mg total) by mouth every 6 (six) hours as needed for up to 5 days., Disp: 20 tablet, Rfl: 0   valsartan (DIOVAN) 160 MG tablet, Take 1 tablet (160 mg total) by mouth daily., Disp: 90 tablet, Rfl: 1  ------------------------------------------------------------------------- Social History   Tobacco Use   Smoking status: Never   Smokeless tobacco: Never  Vaping Use   Vaping status: Never Used  Substance Use Topics   Alcohol use: Yes    Alcohol/week: 1.0 standard drink of alcohol    Types: 1 Glasses of wine per week    Comment: 1 glass 3-4 nights per week   Drug use: Never    Review of Systems Per HPI unless specifically indicated above  Objective:    BP (!) 140/84 (BP Location: Left Arm, Cuff Size: Normal)   Pulse 73   Ht 5' 6.5" (1.689 m)   Wt 167 lb (75.8 kg)   SpO2 100%   BMI 26.55 kg/m   Wt Readings from Last 3 Encounters:  08/11/23 167 lb (75.8 kg)  08/07/23 169 lb 3.2 oz (76.7 kg)  08/01/23 165 lb 5.5 oz (75 kg)    Physical Exam Vitals and nursing note reviewed.  Constitutional:      General: She is not in acute distress.    Appearance: She is well-developed. She is not diaphoretic.     Comments: Well-appearing, comfortable, cooperative  HENT:      Head: Normocephalic and atraumatic.  Eyes:     General:        Right eye: No discharge.        Left eye: No discharge.     Conjunctiva/sclera: Conjunctivae normal.  Neck:     Thyroid: No thyromegaly.  Cardiovascular:     Rate and Rhythm: Normal rate and regular rhythm.     Heart sounds: Normal heart sounds. No murmur heard.    Comments: ZIO patch on Pulmonary:     Effort: Pulmonary effort is normal. No respiratory distress.     Breath sounds: Normal breath sounds. No wheezing or rales.  Musculoskeletal:        General: Normal range of motion.     Cervical back: Normal range of motion and neck supple.  Lymphadenopathy:     Cervical: No cervical adenopathy.  Skin:    General: Skin is warm and dry.     Findings: No erythema or rash.  Neurological:     Mental Status: She is alert and oriented to person, place, and time.  Psychiatric:        Behavior: Behavior normal.     Comments: Well groomed, good eye contact, normal speech and thoughts     I have personally reviewed the radiology report from 08/01/23 on CT Head Neck Body.   Study Result CLINICAL DATA: Head trauma, minor (Age >= 65y); Neck trauma (Age >= 65y)  EXAM: CT HEAD WITHOUT CONTRAST  CT CERVICAL SPINE WITHOUT CONTRAST  TECHNIQUE: Multidetector CT imaging of the head and cervical spine was performed following the standard protocol without intravenous contrast. Multiplanar CT image reconstructions of the cervical spine were also generated.  RADIATION DOSE REDUCTION: This exam was performed according to the departmental dose-optimization program which includes automated exposure control, adjustment of the mA and/or kV according to patient size and/or use of iterative reconstruction technique.  COMPARISON: MRI neck from 04/09/2023.  FINDINGS: CT HEAD FINDINGS  Brain: No evidence of acute infarction, hemorrhage, hydrocephalus, extra-axial collection or mass lesion/mass effect.  Ventricles are normal.  Cerebral volume is age appropriate.  Vascular: No hyperdense vessel or unexpected calcification.  Skull: Normal. Negative for fracture or focal lesion. A probable bone island noted in the left clinoid process.  Sinuses/Orbits: No acute finding.  Other: Visualized mastoid air cells are unremarkable. No mastoid effusion.  CT CERVICAL SPINE FINDINGS  Alignment: There is mild reversal of cervical lordosis. This examination does not assess for ligamentous injury or stability.  Skull base and vertebrae: No acute fracture. No primary bone lesion or focal pathologic process.  Soft tissues and spinal canal: No prevertebral fluid or swelling. No visible canal hematoma.  Disc levels: Mildly reduced disc height at C4-5 and C5-6 levels. Remaining intervertebral discs are maintained. There is mild multilevel marginal osteophyte  formation.  Upper chest: Negative.  Other: None.  IMPRESSION: 1. No acute intracranial abnormality. 2. No acute cervical spine fracture or traumatic subluxation. 3. 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   Electronically Signed By: Jules Schick M.D. On: 08/01/2023 16:22  ---------------------   CLINICAL DATA:  Abdominal trauma, blunt   EXAM: CT ABDOMEN AND PELVIS WITH CONTRAST   TECHNIQUE: Multidetector CT imaging of the abdomen and pelvis was performed using the standard protocol following bolus administration of intravenous contrast.   RADIATION DOSE REDUCTION: This exam was performed according to the departmental dose-optimization program which includes automated exposure control, adjustment of the mA and/or kV according to patient size and/or use of iterative reconstruction technique.   CONTRAST:  80mL OMNIPAQUE IOHEXOL 300 MG/ML  SOLN   COMPARISON:  None Available.   FINDINGS: Examination is mildly limited due to patient's motion during data acquisition.   Lower chest: The lung bases are clear. No pleural effusion.  The heart is normal in size. No pericardial effusion.   Hepatobiliary: The liver is normal in size. Non-cirrhotic configuration. There are multiple (approximately 10) ill-defined hypoattenuating lesions throughout the liver with largest in the right hepatic dome measuring up to 2.1 x 2.4 cm. These can not be characterized as simple cysts on the basis of this exam. However, when compared to the prior exam from 10/10/2022, these lesions exhibited cystic internal attenuation and sharp margins, favoring simple cysts. Therefore, no further evaluation is recommended. No intrahepatic or extrahepatic bile duct dilation. No calcified gallstones. Normal gallbladder wall thickness. No pericholecystic inflammatory changes.   Pancreas: Unremarkable. No pancreatic ductal dilatation or surrounding inflammatory changes.   Spleen: Within normal limits. No focal lesion.   Adrenals/Urinary Tract: There is an indeterminate but stable 1.1 x 1.7 cm left adrenal nodule. Unremarkable right adrenal gland. No suspicious renal mass. No hydronephrosis. No renal or ureteric calculi. Unremarkable urinary bladder.   Stomach/Bowel: No disproportionate dilation of the small or large bowel loops. No evidence of abnormal bowel wall thickening or inflammatory changes. The appendix is unremarkable. There is moderate stool burden.   Vascular/Lymphatic: No ascites or pneumoperitoneum. No abdominal or pelvic lymphadenopathy, by size criteria. No aneurysmal dilation of the major abdominal arteries. There are mild peripheral atherosclerotic vascular calcifications of the aorta and its major branches.   Reproductive: The uterus is surgically absent. No large adnexal mass.   Other: The visualized soft tissues and abdominal wall are unremarkable.   Musculoskeletal: No suspicious osseous lesions. There are mild - moderate multilevel degenerative changes in the visualized spine.   IMPRESSION: 1. No acute traumatic  injury to the abdomen or pelvis. 2. Indeterminate but stable 1.1 x 1.7 cm left adrenal nodule. 3. Multiple hepatic cysts, as discussed above. 4. Multiple other nonacute observations, as described above.   Aortic Atherosclerosis (ICD10-I70.0).   Please note, if prior imaging is available, comparison can be made. Aortic Atherosclerosis (ICD10-I70.0).     Electronically Signed   By: Jules Schick M.D.   On: 08/01/2023 16:15   ---------  New imaging results today  I have personally reviewed the radiology report from 08/11/23 on L Rib series X-ray.  Narrative & Impression  CLINICAL DATA:  Left rib pain after fall last week.   EXAM: LEFT RIBS - 2 VIEW   COMPARISON:  None Available.   FINDINGS: No fracture or other bone lesions are seen involving the ribs.   IMPRESSION: Negative.     Electronically Signed   By: Lupita Raider  M.D.   On: 08/11/2023 11:38    Results for orders placed or performed during the hospital encounter of 08/01/23  CBC with Differential  Result Value Ref Range   WBC 8.2 4.0 - 10.5 K/uL   RBC 4.70 3.87 - 5.11 MIL/uL   Hemoglobin 13.0 12.0 - 15.0 g/dL   HCT 28.4 13.2 - 44.0 %   MCV 83.8 80.0 - 100.0 fL   MCH 27.7 26.0 - 34.0 pg   MCHC 33.0 30.0 - 36.0 g/dL   RDW 10.2 72.5 - 36.6 %   Platelets 276 150 - 400 K/uL   nRBC 0.0 0.0 - 0.2 %   Neutrophils Relative % 57 %   Neutro Abs 4.7 1.7 - 7.7 K/uL   Lymphocytes Relative 25 %   Lymphs Abs 2.0 0.7 - 4.0 K/uL   Monocytes Relative 11 %   Monocytes Absolute 0.9 0.1 - 1.0 K/uL   Eosinophils Relative 6 %   Eosinophils Absolute 0.5 0.0 - 0.5 K/uL   Basophils Relative 1 %   Basophils Absolute 0.1 0.0 - 0.1 K/uL   Immature Granulocytes 0 %   Abs Immature Granulocytes 0.02 0.00 - 0.07 K/uL  Basic metabolic panel  Result Value Ref Range   Sodium 130 (L) 135 - 145 mmol/L   Potassium 3.6 3.5 - 5.1 mmol/L   Chloride 94 (L) 98 - 111 mmol/L   CO2 24 22 - 32 mmol/L   Glucose, Bld 112 (H) 70 - 99  mg/dL   BUN 15 8 - 23 mg/dL   Creatinine, Ser 4.40 0.44 - 1.00 mg/dL   Calcium 9.1 8.9 - 34.7 mg/dL   GFR, Estimated >42 >59 mL/min   Anion gap 12 5 - 15  Troponin I (High Sensitivity)  Result Value Ref Range   Troponin I (High Sensitivity) 3 <18 ng/L      Assessment & Plan:   Problem List Items Addressed This Visit     Benign essential hypertension   Relevant Medications   amLODipine (NORVASC) 10 MG tablet   valsartan (DIOVAN) 160 MG tablet   Other Visit Diagnoses     Vasovagal syncope    -  Primary   Rib pain on left side       Relevant Medications   traMADol (ULTRAM) 50 MG tablet   Other Relevant Orders   DG Ribs Unilateral Left (Completed)      Assessment and Plan    Syncope Suspected Vasovagal Two episodes of syncope on October 11th with no clear precipitating factors. Vasovagal syncope is the most likely cause. Currently wearing a Zio patch for cardiac monitoring. -Continue cardiac monitoring with Zio patch. -Follow up with cardiology as planned, on results, and further imaging  Possible Rib Injury, suspected contusion / bruise Left-sided chest pain after syncope episodes, possibly due to trauma during fall. No difficulty breathing, but pain is exacerbated by coughing and lying on the left side. -Order chest x-ray to evaluate for possible rib fracture. - results negative on x-ray Ordered Tramadol for breakthrough pain otherwise use Tylenol  Hypertension Mildly elevated blood pressure. Patient reports frequent urination, possibly due to diuretic component of current antihypertensive medication (Losartan/Hydrochlorothiazide). -Discontinue Losartan/Hydrochlorothiazide. Due to dehydration and urinary freq  -Start Valsartan 160mg  daily and Amlodipine 10mg  daily. -Advise patient to monitor for possible side effects  General Health Maintenance -Administer influenza vaccine at next opportunity.      Orders Placed This Encounter  Procedures   DG Ribs Unilateral  Left    Standing Status:  Future    Number of Occurrences:   1    Standing Expiration Date:   08/10/2024    Order Specific Question:   Reason for Exam (SYMPTOM  OR DIAGNOSIS REQUIRED)    Answer:   syncope fall Left rib lateral pain    Order Specific Question:   Preferred imaging location?    Answer:   [facility]      Meds ordered this encounter  Medications   amLODipine (NORVASC) 10 MG tablet    Sig: Take 1 tablet (10 mg total) by mouth daily.    Dispense:  90 tablet    Refill:  1   valsartan (DIOVAN) 160 MG tablet    Sig: Take 1 tablet (160 mg total) by mouth daily.    Dispense:  90 tablet    Refill:  1   traMADol (ULTRAM) 50 MG tablet    Sig: Take 1 tablet (50 mg total) by mouth every 6 (six) hours as needed for up to 5 days.    Dispense:  20 tablet    Refill:  0    Follow up plan: Return if symptoms worsen or fail to improve.   Saralyn Pilar, DO [facility] 08/11/2023, 9:00 AM

## 2023-08-11 NOTE — Telephone Encounter (Signed)
Patient is seen today in clinic for hospital follow up. Encounter closed.

## 2023-08-11 NOTE — Patient Instructions (Addendum)
Thank you for coming to the office today.  Keep up with Cardiology  BP medication change today  Stop Losartan-hydrochlorothiazide 100-25mg  once daily  NEW BP medications  Valsartan 160mg  daily + Amlodipine 10mg  daily  Caution with swelling in feet ankles can elevate legs if you get swelling.  ------------------  Left Rib X-ray today  We will let you know results.  If broken rib we can order Tramadol - stronger rx pain pill, take cautiously to avoid dizzy lightheaded.  If not broken  Okay to take high dose Tylenol regularly Tylenol Ext Str 500mg  tabs - take 1 to 2 (max dose 1000mg ) every 6 hours as needed for breakthrough pain, max 24 hour daily dose is 6 tabs 3000mg  daily  Recommend trial of Anti-inflammatory with Naproxen (Naprosyn) 500mg  tabs - take one with food and plenty of water TWICE daily every day (breakfast and dinner), for next 1 to 2 weeks, then you may take only as needed   Please schedule a Follow-up Appointment to: Return if symptoms worsen or fail to improve.  If you have any other questions or concerns, please feel free to call the office or send a message through MyChart. You may also schedule an earlier appointment if necessary.  Additionally, you may be receiving a survey about your experience at our office within a few days to 1 week by e-mail or mail. We value your feedback.  Saralyn Pilar, DO [facility], [place]

## 2023-08-27 ENCOUNTER — Other Ambulatory Visit: Payer: Self-pay | Admitting: Family Medicine

## 2023-08-27 ENCOUNTER — Ambulatory Visit: Payer: Medicare PPO | Admitting: Family Medicine

## 2023-08-27 VITALS — BP 120/82 | HR 78 | Ht 66.5 in | Wt 169.2 lb

## 2023-08-27 DIAGNOSIS — Z23 Encounter for immunization: Secondary | ICD-10-CM | POA: Diagnosis not present

## 2023-08-27 DIAGNOSIS — Z0000 Encounter for general adult medical examination without abnormal findings: Secondary | ICD-10-CM

## 2023-08-27 DIAGNOSIS — I10 Essential (primary) hypertension: Secondary | ICD-10-CM | POA: Diagnosis not present

## 2023-08-27 DIAGNOSIS — R7309 Other abnormal glucose: Secondary | ICD-10-CM

## 2023-08-27 DIAGNOSIS — E7800 Pure hypercholesterolemia, unspecified: Secondary | ICD-10-CM

## 2023-08-27 LAB — POCT GLYCOSYLATED HEMOGLOBIN (HGB A1C): Hemoglobin A1C: 6 % — AB (ref 4.0–5.6)

## 2023-08-27 NOTE — Patient Instructions (Addendum)
Thank you for coming to the office today.  BP is controlled now on current meds. Stay on the current mix Amlodipine 10mg  daily Valsartan 160mg  daily  The swelling in the lower extremities is due to the Amlodipine Keep with RICE therapy,   Use RICE therapy: - R - Rest / relative rest with activity modification avoid overuse of joint - I - Ice packs (make sure you use a towel or sock / something to protect skin) - C - Compression with compression stocking or sock to apply pressure and reduce swelling allowing more support - E - Elevation - if significant swelling, lift leg above heart level (toes above your nose) to help reduce swelling, most helpful at night after day of being on your feet  The swelling is not harmful. But can be bothersome.  If still persistent swelling and BP is controlled, next time we can consider HALF DOSE Amlodipine to 5mg .  DUE for FASTING BLOOD WORK (no food or drink after midnight before the lab appointment, only water or coffee without cream/sugar on the morning of)  SCHEDULE "Lab Only" visit in the morning at the clinic for lab draw in 6  MONTHS   - Make sure Lab Only appointment is at about 1 week before your next appointment, so that results will be available  For Lab Results, once available within 2-3 days of blood draw, you can can log in to MyChart online to view your results and a brief explanation. Also, we can discuss results at next follow-up visit.   Please schedule a Follow-up Appointment to: Return in about 6 months (around 02/24/2024) for 6 month fasting lab only then 1 week later Annual (Tues, Weds, Thurs best, prefer AM any time).  If you have any other questions or concerns, please feel free to call the office or send a message through MyChart. You may also schedule an earlier appointment if necessary.  Additionally, you may be receiving a survey about your experience at our office within a few days to 1 week by e-mail or mail. We value your  feedback.  Saralyn Pilar, DO [facility], [place]

## 2023-08-27 NOTE — Assessment & Plan Note (Signed)
Improved HYPERTENSION control with recent med change No known complications   Off Thiazide Diuretic - resolved urinary frequency Edema on Amlodipine but tolerating well   Plan:  1. Continue current BP regimen - Valsartan 160mg  daily + Amlodipine 10mg  daily 2. Encourage improved lifestyle - low sodium diet, regular exercise 3. Continue monitor BP outside office, bring readings to next visit, if persistently >140/90 or new symptoms notify office sooner  Future consider dose reduction Amlodipine to 5mg  next to see if improves her swelling. However edema seems manageable so far with RICE therapy. Consider combination pill for HTN

## 2023-08-27 NOTE — Progress Notes (Signed)
Subjective:    Patient ID: Brenda Caldwell, female    DOB: Aug 25, 1951, 72 y.o.   MRN: [id number]  Brenda Caldwell is a 72 y.o. female presenting on 08/27/2023 for Prediabetes   HPI  Discussed the use of AI scribe software for clinical note transcription with the patient, who gave verbal consent to proceed.      Abnormal Weight Gain Concern with weight gain, unintentional Previously reviewed. Seems to have stabilized at this time. No change wt gain in past 1 month.  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 This issue was unresolved but pulmonology ruled out significant problems.  Anxiety On Escitalopram 10mg  daily Rarely taking Lorazepam 0.5mg  AS NEEDED. Needs re order   CHRONIC HTN: Last visit 08/11/23, she had urinary frequency on Thiazide. Still higher BP, switched to Amlodipine 10mg  daily and swapped ARB to Valsartan. Current Meds - Valsartan 160mg  daily, Amlodipine 10mg  daily Reports good compliance, took meds today. Tolerating well, w/o complaints. Admits some edema, dependent following Amlodipine. Improves in PM if elevation of feet. Not painful or other complication Note her urinary frequency has resolved Denies CP, dyspnea, HA, edema, dizziness / lightheadedness  Rib pain improved from recent injury, only took Tramadol 4 times total AS NEEDED, no longer using it.  Vasovagal Syncopal episode Followed by Cardiology Awaiting ZIO patch results from Cardiology     Health Maintenance: Updated Colonoscopy 07/30/19, negative no polyps, repeat 5 yr Next due 2025  Due for Flu Shot today     08/27/2023    9:58 AM 08/11/2023    8:39 AM 06/12/2023    9:26 AM  Depression screen PHQ 2/9  Decreased Interest 1 0 0  Down, Depressed, Hopeless 0 0 1  PHQ - 2 Score 1 0 1  Altered sleeping 0 0 0  Tired, decreased energy 0 0 0  Change in appetite 0 0 0  Feeling bad or  failure about yourself  0 0 0  Trouble concentrating 0 0 1  Moving slowly or fidgety/restless 0 0 0  Suicidal thoughts 0 0 0  PHQ-9 Score 1 0 2  Difficult doing work/chores Not difficult at all Not difficult at all Not difficult at all    Social History   Tobacco Use   Smoking status: Never   Smokeless tobacco: Never  Vaping Use   Vaping status: Never Used  Substance Use Topics   Alcohol use: Yes    Alcohol/week: 1.0 standard drink of alcohol    Types: 1 Glasses of wine per week    Comment: 1 glass 3-4 nights per week   Drug use: Never    Review of Systems Per HPI unless specifically indicated above     Objective:    BP 120/82   Pulse 78   Ht 5' 6.5" (1.689 m)   Wt 169 lb 3.2 oz (76.7 kg)   SpO2 94%   BMI 26.90 kg/m   Wt Readings from Last 3 Encounters:  08/27/23 169 lb 3.2 oz (76.7 kg)  08/11/23 167 lb (75.8 kg)  08/07/23 169 lb 3.2 oz (76.7 kg)    Physical Exam Vitals and nursing note reviewed.  Constitutional:      General: She is not in acute distress.    Appearance: She is well-developed. She is not diaphoretic.     Comments: Well-appearing, comfortable, cooperative  HENT:     Head: Normocephalic and atraumatic.  Eyes:     General:        Right eye: No discharge.        Left eye: No discharge.     Conjunctiva/sclera: Conjunctivae normal.  Neck:     Thyroid: No thyromegaly.  Cardiovascular:     Rate and Rhythm: Normal rate and regular rhythm.     Heart sounds: Normal heart sounds. No murmur heard. Pulmonary:     Effort: Pulmonary effort is normal. No respiratory distress.     Breath sounds: Normal breath sounds. No wheezing or rales.  Musculoskeletal:        General: Normal range of motion.     Cervical back: Normal range of motion and neck supple.  Lymphadenopathy:     Cervical: No cervical adenopathy.  Skin:    General: Skin is warm and dry.     Findings: No erythema or rash.  Neurological:     Mental Status: She is alert and oriented to  person, place, and time.  Psychiatric:        Behavior: Behavior normal.     Comments: Well groomed, good eye contact, normal speech and thoughts       Results for orders placed or performed in visit on 08/27/23  POCT glycosylated hemoglobin (Hb A1C)  Result Value Ref Range   Hemoglobin A1C 6.0 (A) 4.0 - 5.6 %   HbA1c POC (<> result, manual entry)     HbA1c, POC (prediabetic range)     HbA1c, POC (controlled diabetic range)    HM COLONOSCOPY  Result Value Ref Range   HM Colonoscopy See Report (in chart) See Report (in chart), Patient Reported      Assessment & Plan:   Problem List Items Addressed This Visit     Benign essential hypertension - Primary    Improved HYPERTENSION control with recent med change No known complications   Off Thiazide Diuretic - resolved urinary frequency Edema on Amlodipine but tolerating well   Plan:  1. Continue current BP regimen - Valsartan 160mg  daily + Amlodipine 10mg  daily 2. Encourage improved lifestyle - low sodium diet, regular exercise 3. Continue monitor BP outside office, bring readings to next visit, if persistently >140/90 or new symptoms notify office sooner  Future consider dose reduction Amlodipine to 5mg  next to see if improves her swelling. However edema seems manageable so far with RICE therapy. Consider combination pill for HTN      Other Visit Diagnoses     Abnormal glucose       Relevant Orders   POCT glycosylated hemoglobin (Hb A1C) (Completed)   Needs flu shot       Relevant Orders   Flu Vaccine Trivalent High Dose (Fluad) (Completed)       No orders of the defined types were placed in this encounter.   General Health Maintenance -Administer influenza vaccine today. -Schedule follow-up appointment in May for annual check-up and blood panel.  Musculoskeletal Pain Complaints of hip pain and elbow pain, possibly related to arthritis or recent fal -Monitor symptoms.  History of Vasovagal Syncopal  episode Followed by Cardiology -Await results of Zio patch monitoring.  Weight Management Minimal weight loss noted, possibly related to fluid retention from Amlodipine.    Follow up plan: Return in about 6 months (around 02/24/2024) for 6 month fasting lab only then 1 week later Annual (Tues, Weds, Thurs best, prefer AM any time).  Future labs ordered for 02/24/24    Saralyn Pilar, DO [facility] 08/27/2023, 10:08 AM

## 2023-08-29 ENCOUNTER — Ambulatory Visit (INDEPENDENT_AMBULATORY_CARE_PROVIDER_SITE_OTHER): Payer: Medicare PPO

## 2023-08-29 ENCOUNTER — Ambulatory Visit: Payer: Medicare PPO | Attending: Cardiology

## 2023-08-29 DIAGNOSIS — R55 Syncope and collapse: Secondary | ICD-10-CM | POA: Diagnosis not present

## 2023-08-29 LAB — ECHOCARDIOGRAM COMPLETE
AR max vel: 2.79 cm2
AV Area VTI: 2.62 cm2
AV Area mean vel: 2.59 cm2
AV Mean grad: 3 mm[Hg]
AV Peak grad: 5.3 mm[Hg]
Ao pk vel: 1.15 m/s
Area-P 1/2: 3.6 cm2
Calc EF: 56.1 %
S' Lateral: 3.4 cm
Single Plane A2C EF: 55.8 %
Single Plane A4C EF: 55.9 %

## 2023-10-02 IMAGING — MG MM DIGITAL SCREENING BILAT W/ TOMO AND CAD
8 series · 9 of 24 positions shown · non-contrast
Comparison: Previous exam(s).

CLINICAL DATA: Screening.

EXAM:
DIGITAL SCREENING BILATERAL MAMMOGRAM WITH TOMOSYNTHESIS AND CAD
TECHNIQUE: Bilateral screening digital craniocaudal and mediolateral oblique
mammograms were obtained. Bilateral screening digital breast
tomosynthesis was performed. The images were evaluated with
computer-aided detection.

[L CC synth-2D]
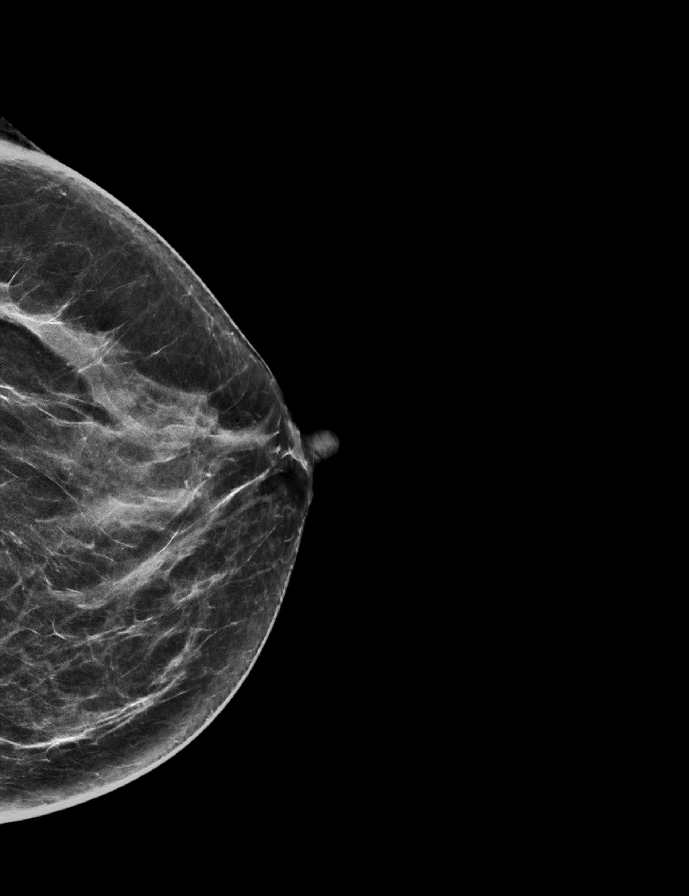

[R CC synth-2D]
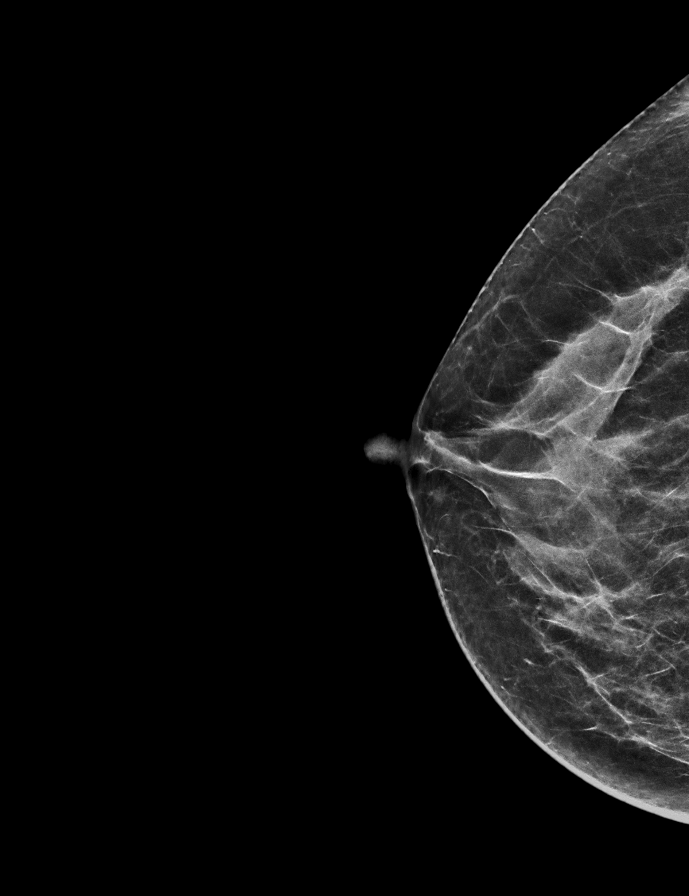

[R MLO synth-2D]
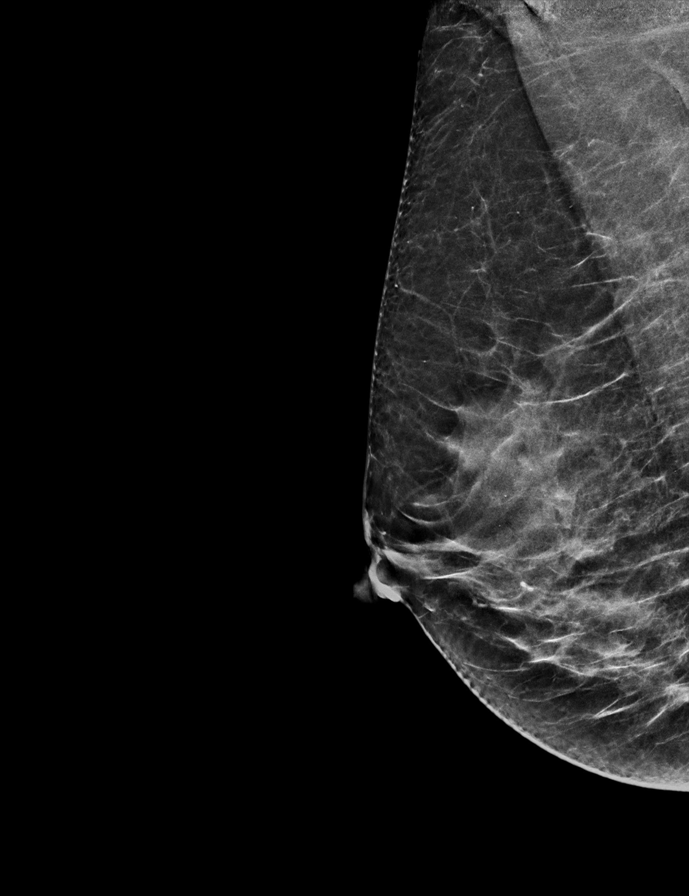

[L MLO synth-2D]
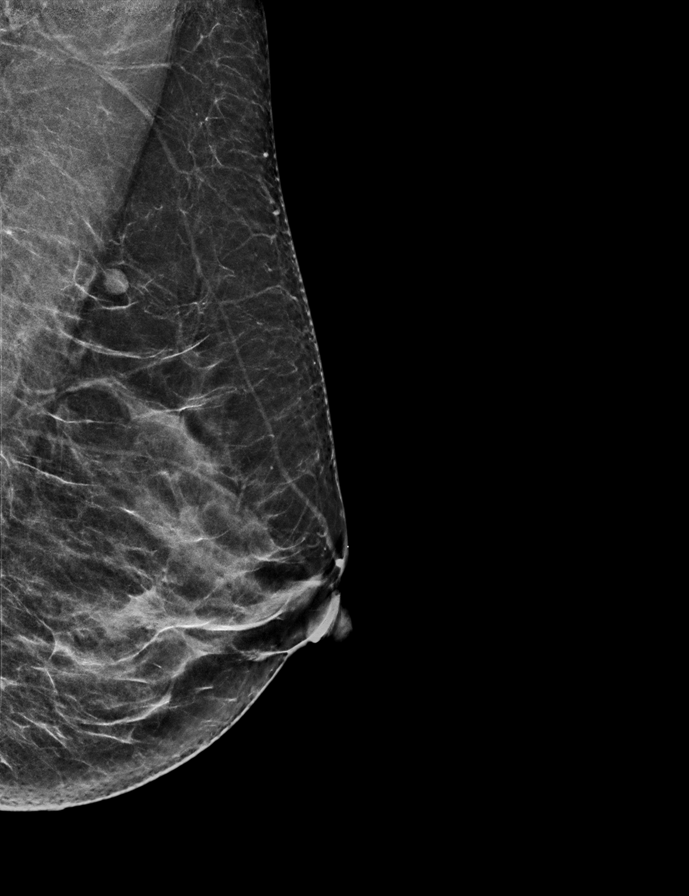

[R CC tomo · 2 of 59 frames shown]
[frame 20/59]
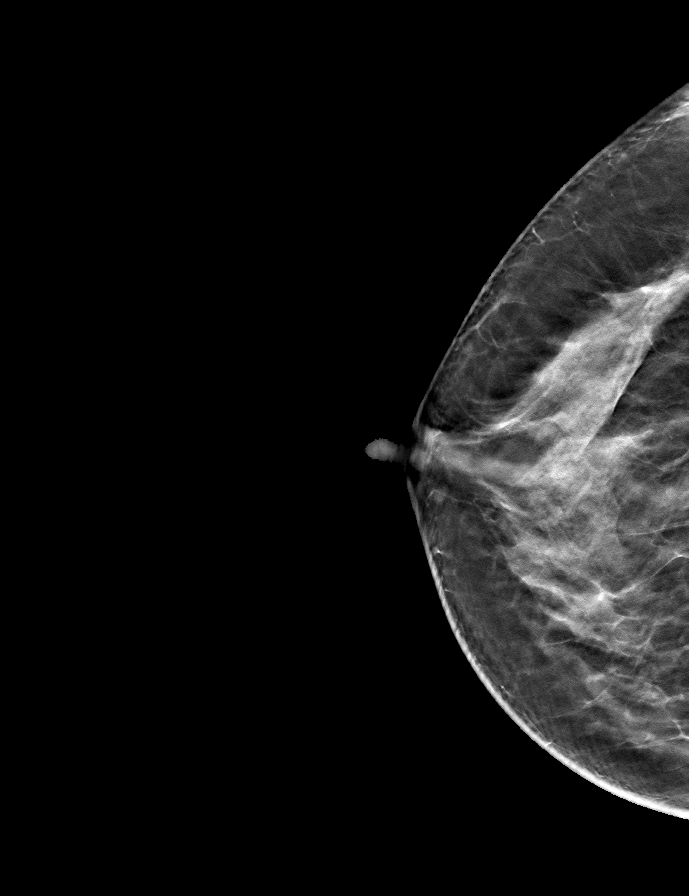
[frame 30/59]
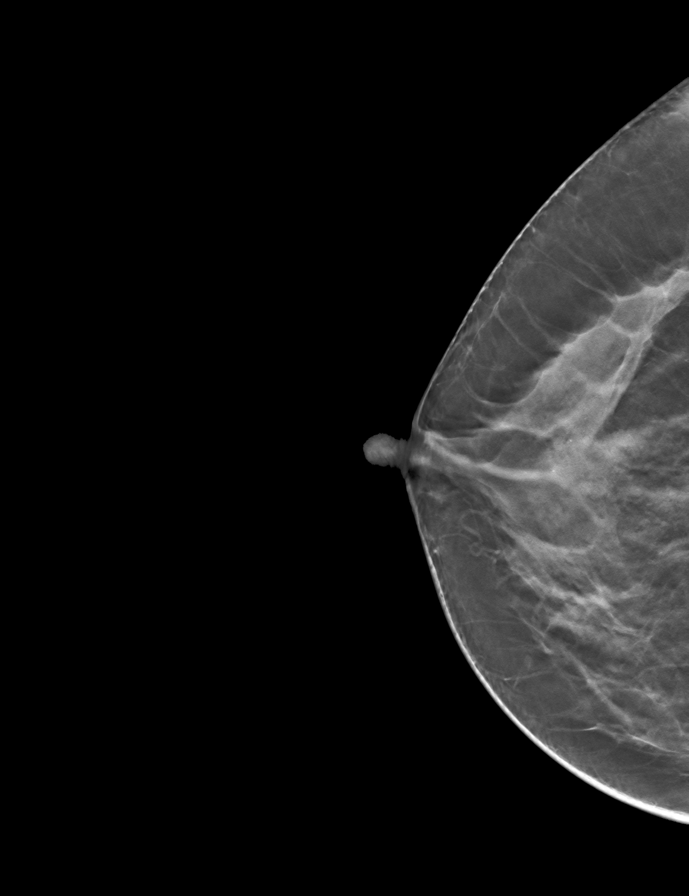

[L CC tomo · tomo slice 30/59.0]
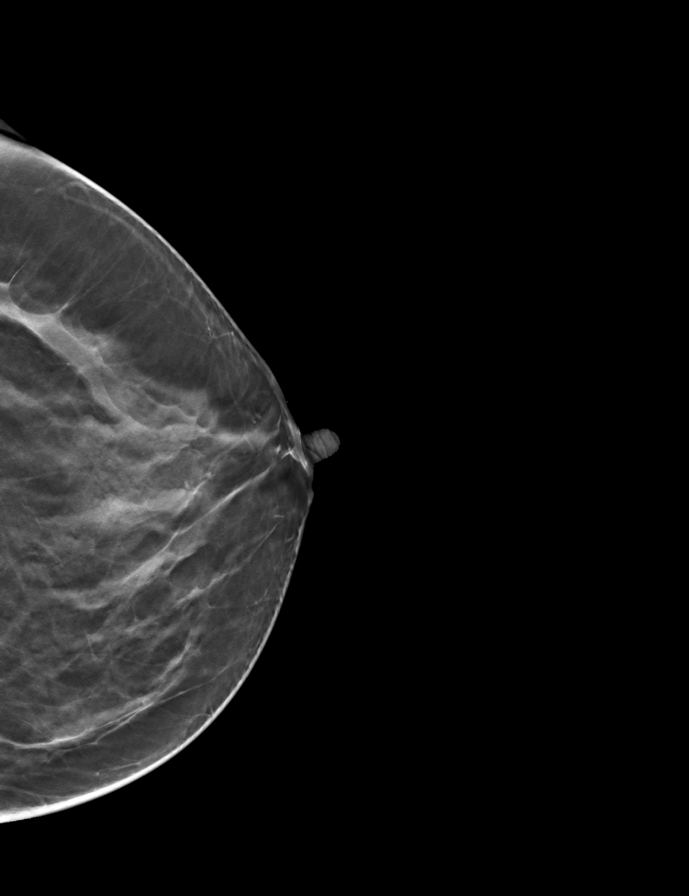

[L MLO tomo · tomo slice 29/57.0]
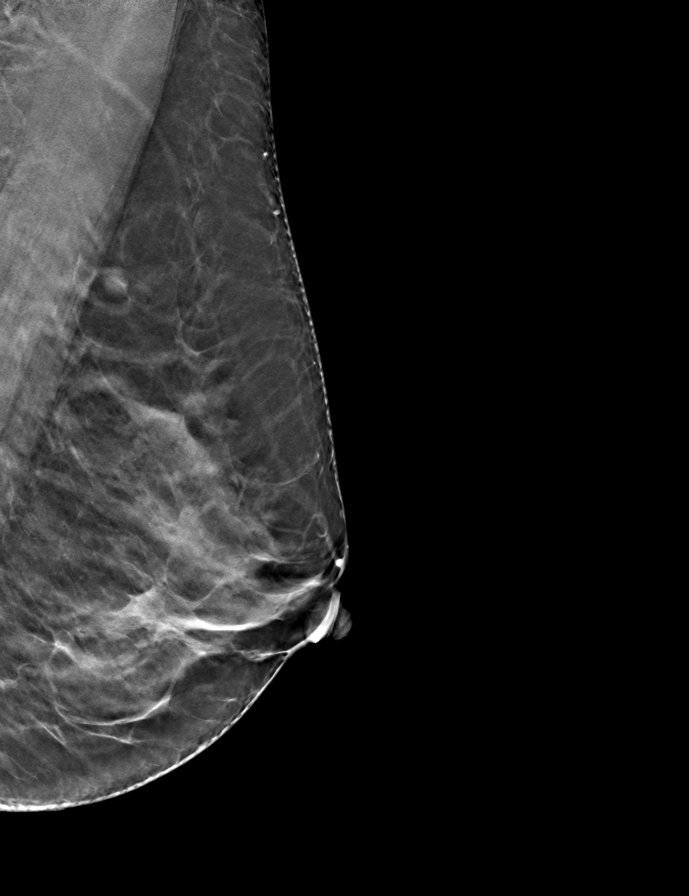

[R MLO tomo · tomo slice 29/57.0]
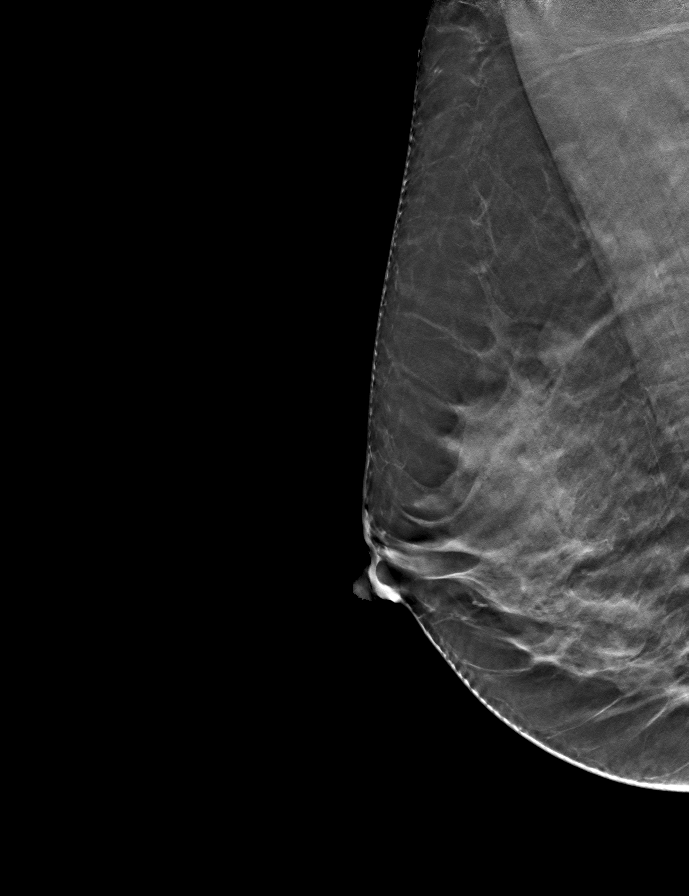

[9 of 24 positions shown; findings below may reference images not displayed]

ACR Breast Density Category c: The breast tissue is heterogeneously
dense, which may obscure small masses.
FINDINGS: There are no findings suspicious for malignancy.
IMPRESSION: No mammographic evidence of malignancy. A result letter of this
screening mammogram will be mailed directly to the patient.

RECOMMENDATION:
Screening mammogram in one year. (Code:Q3-W-BC3)

BI-RADS CATEGORY  1: Negative.

## 2023-10-07 ENCOUNTER — Ambulatory Visit: Payer: Medicare PPO | Attending: Cardiology | Admitting: Cardiology

## 2023-10-07 ENCOUNTER — Encounter: Payer: Self-pay | Admitting: Cardiology

## 2023-10-07 VITALS — BP 124/86 | HR 76 | Ht 67.0 in | Wt 171.4 lb

## 2023-10-07 DIAGNOSIS — I10 Essential (primary) hypertension: Secondary | ICD-10-CM

## 2023-10-07 DIAGNOSIS — I77810 Thoracic aortic ectasia: Secondary | ICD-10-CM | POA: Diagnosis not present

## 2023-10-07 DIAGNOSIS — R55 Syncope and collapse: Secondary | ICD-10-CM | POA: Diagnosis not present

## 2023-10-07 MED ORDER — VALSARTAN 160 MG PO TABS: 320.0000 mg | ORAL_TABLET | Freq: Every day | ORAL | 0 refills | Status: DC

## 2023-10-07 MED ORDER — AMLODIPINE BESYLATE 10 MG PO TABS: 5.0000 mg | ORAL_TABLET | Freq: Every day | ORAL | Status: DC

## 2023-10-07 MED ORDER — AMLODIPINE BESYLATE 5 MG PO TABS: 5.0000 mg | ORAL_TABLET | Freq: Every day | ORAL | 0 refills | Status: DC

## 2023-10-07 NOTE — Progress Notes (Signed)
Cardiology Office Note:    Date:  10/07/2023   ID:  RICKELL CHAMPLIN, DOB Jan 14, 1951, MRN [id number]  PCP:  Smitty Cords, DO   [facility] Providers Cardiologist:  Debbe Odea, MD     Referring MD: Saralyn Pilar *   Chief Complaint  Patient presents with   Follow-up    Discuss Zio results.  New lower extremity edea since starting Amlodipine and Valsartan.      History of Present Illness:    Brenda Caldwell is a 72 y.o. female with a hx of hypertension, presenting for follow-up.  Previously seen due to syncope.  Workup with echo, cardiac monitor and carotid ultrasound was performed.  Presents for cardiac testing results.  BP medications were adjusted by primary care physician.  Norvasc 10 mg daily was started.  She has noticed edema since starting BP meds.  Otherwise doing okay.  No further episodes of syncope.  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 Carotid ultrasound 08/2023 no stenosis. Cardiac monitor 08/2023 frequent PACs, no sustained arrhythmias, no A-fib or flutter.   Past Medical History:  Diagnosis Date   Anxiety    Hypertension     Past Surgical History:  Procedure Laterality Date   bladder tack  2000   TOTAL ABDOMINAL HYSTERECTOMY  2000    Current Medications: Current Meds  Medication Sig   escitalopram (LEXAPRO) 10 MG tablet Take 1 tablet (10 mg total) by mouth daily.   fluticasone (FLONASE) 50 MCG/ACT nasal spray Place 2 sprays into both nostrils daily.   LORazepam (ATIVAN) 0.5 MG tablet Take 1 tablet (0.5 mg total) by mouth daily as needed for anxiety.   omeprazole (PRILOSEC) 20 MG capsule Take 1 capsule (20 mg total) by mouth daily before breakfast.   [DISCONTINUED] amLODipine (NORVASC) 10 MG tablet Take 1 tablet (10 mg total) by mouth daily.   [DISCONTINUED] valsartan (DIOVAN) 160 MG tablet Take 1 tablet (160 mg total) by mouth daily.     Allergies:   Patient has  no known allergies.   Social History   Socioeconomic History   Marital status: Divorced    Spouse name: Not on file   Number of children: Not on file   Years of education: Not on file   Highest education level: Bachelor's degree (e.g., BA, AB, BS)  Occupational History   Not on file  Tobacco Use   Smoking status: Never   Smokeless tobacco: Never  Vaping Use   Vaping status: Never Used  Substance and Sexual Activity   Alcohol use: Yes    Alcohol/week: 1.0 standard drink of alcohol    Types: 1 Glasses of wine per week    Comment: 1 glass 3-4 nights per week   Drug use: Never   Sexual activity: Not on file  Other Topics Concern   Not on file  Social History Narrative   Not on file   Social Drivers of Health   Financial Resource Strain: Low Risk  (08/27/2023)   Overall Financial Resource Strain (CARDIA)    Difficulty of Paying Living Expenses: Not hard at all  Food Insecurity: No Food Insecurity (08/27/2023)   Hunger Vital Sign    Worried About Running Out of Food in the Last Year: Never true    Ran Out of Food in the Last Year: Never true  Transportation Needs: No Transportation Needs (08/27/2023)   PRAPARE - Administrator, Civil Service (Medical): No  Lack of Transportation (Non-Medical): No  Physical Activity: Insufficiently Active (08/27/2023)   Exercise Vital Sign    Days of Exercise per Week: 3 days    Minutes of Exercise per Session: 40 min  Stress: No Stress Concern Present (08/27/2023)   [employer] of Occupational Health - Occupational Stress Questionnaire    Feeling of Stress : Only a little  Social Connections: Moderately Integrated (08/27/2023)   Social Connection and Isolation Panel [NHANES]    Frequency of Communication with Friends and Family: More than three times a week    Frequency of Social Gatherings with Friends and Family: More than three times a week    Attends Religious Services: Never    Database administrator or Organizations:  Yes    Attends Engineer, structural: More than 4 times per year    Marital Status: Married     Family History: The patient's family history includes Bipolar disorder in her brother; Depression in her mother; Heart disease in her maternal grandmother; Lung cancer in her mother; Stomach cancer in her father.  ROS:   Please see the history of present illness.     All other systems reviewed and are negative.  EKGs/Labs/Other Studies Reviewed:    The following studies were reviewed today:       Recent Labs: 02/24/2023: ALT 15; TSH 2.12 08/01/2023: BUN 15; Creatinine, Ser 0.58; Hemoglobin 13.0; Platelets 276; Potassium 3.6; Sodium 130  Recent Lipid Panel    Component Value Date/Time   CHOL 218 (H) 02/24/2023 1022   TRIG 172 (H) 02/24/2023 1022   HDL 70 02/24/2023 1022   CHOLHDL 3.1 02/24/2023 1022   LDLCALC 119 (H) 02/24/2023 1022     Risk Assessment/Calculations:              Physical Exam:    VS:  BP 124/86 (BP Location: Left Arm, Patient Position: Sitting, Cuff Size: Normal)   Pulse 76   Ht 5\' 7"  (1.702 m)   Wt 171 lb 6.4 oz (77.7 kg)   SpO2 98%   BMI 26.85 kg/m     Wt Readings from Last 3 Encounters:  10/07/23 171 lb 6.4 oz (77.7 kg)  08/27/23 169 lb 3.2 oz (76.7 kg)  08/11/23 167 lb (75.8 kg)     GEN:  Well nourished, well developed in no acute distress HEENT: Normal NECK: No JVD; No carotid bruits CARDIAC: RRR, no murmurs, rubs, gallops RESPIRATORY:  Clear to auscultation without rales, wheezing or rhonchi  MUSCULOSKELETAL:  1+ edema;  SKIN: Warm and dry NEUROLOGIC:  Alert and oriented x 3 PSYCHIATRIC:  Normal affect   ASSESSMENT:    1. Syncope and collapse   2. Mild ascending aorta dilation (HCC)   3. Primary hypertension   4. Benign essential hypertension     PLAN:    In order of problems listed above:  Syncope, cardiac monitor showed PACs, no sustained arrhythmias, no A-fib or flutter, no findings to suggest etiology of syncope.   Echo with normal EF 55 to 60%, mild ascending aorta dilatation measuring 40 mm.  Carotid artery ultrasound with no evidence for stenosis.  Etiology appears vasovagal with symptoms of nausea prior to passing out.  Previous orthostatic vitals with no evidence for orthostasis.  Patient made aware of results.  Obtain echo serially to monitor mild ascending aorta dilatation. Hypertension, BP controlled.  But patient endorses edema.  Likely from Norvasc.  Decrease dose of Norvasc to 5 mg daily.  Increase valsartan to 320 mg  daily.  If edema does not improve at follow-up visit, plan to stop Norvasc and start HCTZ.   Follow-up in 2 months.      Medication Adjustments/Labs and Tests Ordered: Current medicines are reviewed at length with the patient today.  Concerns regarding medicines are outlined above.  No orders of the defined types were placed in this encounter.  Meds ordered this encounter  Medications   DISCONTD: amLODipine (NORVASC) 10 MG tablet    Sig: Take 0.5 tablets (5 mg total) by mouth daily.   valsartan (DIOVAN) 160 MG tablet    Sig: Take 2 tablets (320 mg total) by mouth daily.    Dispense:  180 tablet    Refill:  0   amLODipine (NORVASC) 5 MG tablet    Sig: Take 1 tablet (5 mg total) by mouth daily.    Dispense:  90 tablet    Refill:  0    Patient Instructions  Medication Instructions:   DECREASE Amlodipine - Take 0.5 tablet ( 5mg ) by mouth daily.  INCREASE Valsartan - Take two tablet ( 320mg ) by mouth daily.   *If you need a refill on your cardiac medications before your next appointment, please call your pharmacy*   Lab Work:  None Ordered  If you have labs (blood work) drawn today and your tests are completely normal, you will receive your results only by: MyChart Message (if you have MyChart) OR A paper copy in the mail If you have any lab test that is abnormal or we need to change your treatment, we will call you to review the  results.   Testing/Procedures:  None Ordered   Follow-Up: At [facility], you and your health needs are our priority.  As part of our continuing mission to provide you with exceptional heart care, we have created designated Provider Care Teams.  These Care Teams include your primary Cardiologist (physician) and Advanced Practice Providers (APPs -  Physician Assistants and Nurse Practitioners) who all work together to provide you with the care you need, when you need it.  We recommend signing up for the patient portal called "MyChart".  Sign up information is provided on this After Visit Summary.  MyChart is used to connect with patients for Virtual Visits (Telemedicine).  Patients are able to view lab/test results, encounter notes, upcoming appointments, etc.  Non-urgent messages can be sent to your provider as well.   To learn more about what you can do with MyChart, go to [email or web address].    Your next appointment:   2 month(s)  Provider:   You may see Debbe Odea, MD or one of the following Advanced Practice Providers on your designated Care Team:   Nicolasa Ducking, NP Eula Listen, PA-C Cadence Fransico Michael, PA-C Charlsie Quest, NP Carlos Levering, NP    Signed, Debbe Odea, MD  10/07/2023 10:18 AM    [facility]

## 2023-10-07 NOTE — Patient Instructions (Signed)
Medication Instructions:   DECREASE Amlodipine - Take 0.5 tablet ( 5mg ) by mouth daily.  INCREASE Valsartan - Take two tablet ( 320mg ) by mouth daily.   *If you need a refill on your cardiac medications before your next appointment, please call your pharmacy*   Lab Work:  None Ordered  If you have labs (blood work) drawn today and your tests are completely normal, you will receive your results only by: MyChart Message (if you have MyChart) OR A paper copy in the mail If you have any lab test that is abnormal or we need to change your treatment, we will call you to review the results.   Testing/Procedures:  None Ordered   Follow-Up: At [facility], you and your health needs are our priority.  As part of our continuing mission to provide you with exceptional heart care, we have created designated Provider Care Teams.  These Care Teams include your primary Cardiologist (physician) and Advanced Practice Providers (APPs -  Physician Assistants and Nurse Practitioners) who all work together to provide you with the care you need, when you need it.  We recommend signing up for the patient portal called "MyChart".  Sign up information is provided on this After Visit Summary.  MyChart is used to connect with patients for Virtual Visits (Telemedicine).  Patients are able to view lab/test results, encounter notes, upcoming appointments, etc.  Non-urgent messages can be sent to your provider as well.   To learn more about what you can do with MyChart, go to [email or web address].    Your next appointment:   2 month(s)  Provider:   You may see Debbe Odea, MD or one of the following Advanced Practice Providers on your designated Care Team:   Nicolasa Ducking, NP Eula Listen, PA-C Cadence Fransico Michael, PA-C Charlsie Quest, NP Carlos Levering, NP

## 2023-12-08 ENCOUNTER — Ambulatory Visit: Payer: Medicare PPO | Attending: Cardiology | Admitting: Cardiology

## 2023-12-08 ENCOUNTER — Encounter: Payer: Self-pay | Admitting: Cardiology

## 2023-12-08 VITALS — BP 134/88 | HR 97 | Ht 67.0 in | Wt 165.8 lb

## 2023-12-08 DIAGNOSIS — I77810 Thoracic aortic ectasia: Secondary | ICD-10-CM | POA: Diagnosis not present

## 2023-12-08 DIAGNOSIS — I10 Essential (primary) hypertension: Secondary | ICD-10-CM

## 2023-12-08 NOTE — Patient Instructions (Signed)
 Medication Instructions:   Your physician recommends that you continue on your current medications as directed. Please refer to the Current Medication list given to you today.  *If you need a refill on your cardiac medications before your next appointment, please call your pharmacy*   Lab Work:  None Ordered  If you have labs (blood work) drawn today and your tests are completely normal, you will receive your results only by: MyChart Message (if you have MyChart) OR A paper copy in the mail If you have any lab test that is abnormal or we need to change your treatment, we will call you to review the results.   Testing/Procedures:  None Ordered   Follow-Up: At [facility], you and your health needs are our priority.  As part of our continuing mission to provide you with exceptional heart care, we have created designated Provider Care Teams.  These Care Teams include your primary Cardiologist (physician) and Advanced Practice Providers (APPs -  Physician Assistants and Nurse Practitioners) who all work together to provide you with the care you need, when you need it.  We recommend signing up for the patient portal called "MyChart".  Sign up information is provided on this After Visit Summary.  MyChart is used to connect with patients for Virtual Visits (Telemedicine).  Patients are able to view lab/test results, encounter notes, upcoming appointments, etc.  Non-urgent messages can be sent to your provider as well.   To learn more about what you can do with MyChart, go to [email or web address].    Your next appointment:   1 year(s)  Provider:   You may see Debbe Odea, MD or one of the following Advanced Practice Providers on your designated Care Team:   Nicolasa Ducking, NP Eula Listen, PA-C Cadence Fransico Michael, PA-C Charlsie Quest, NP Carlos Levering, NP

## 2023-12-08 NOTE — Progress Notes (Signed)
 Cardiology Office Note:    Date:  12/08/2023   ID:  Brenda Caldwell, DOB 12-08-1950, MRN [id number]  PCP:  Smitty Cords, DO   [facility] Providers Cardiologist:  Debbe Odea, MD     Referring MD: Saralyn Pilar *   Chief Complaint  Patient presents with   Follow-up    Discuss cardiac testing results.  Patient denies new or acute cardiac problems/concerns today.      History of Present Illness:    Brenda Caldwell is a 73 y.o. female with a hx of hypertension, presenting for follow-up.   Previously seen for hypertension and leg edema.  Norvasc was reduced to 5 mg daily, Diovan increase.  Edema has improved since medication changes.  Feels well from a cardiac perspective.  Little emotional today due to losing daughter about 3 weeks ago from cervical cancer.  Husband is also currently in hospice care.  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 Carotid ultrasound 08/2023 no stenosis. Cardiac monitor 08/2023 frequent PACs, no sustained arrhythmias, no A-fib or flutter.   Past Medical History:  Diagnosis Date   Anxiety    Hypertension     Past Surgical History:  Procedure Laterality Date   bladder tack  2000   TOTAL ABDOMINAL HYSTERECTOMY  2000    Current Medications: Current Meds  Medication Sig   amLODipine (NORVASC) 5 MG tablet Take 1 tablet (5 mg total) by mouth daily.   escitalopram (LEXAPRO) 10 MG tablet Take 1 tablet (10 mg total) by mouth daily.   fluticasone (FLONASE) 50 MCG/ACT nasal spray Place 2 sprays into both nostrils daily.   LORazepam (ATIVAN) 0.5 MG tablet Take 1 tablet (0.5 mg total) by mouth daily as needed for anxiety.   omeprazole (PRILOSEC) 20 MG capsule Take 1 capsule (20 mg total) by mouth daily before breakfast.   valsartan (DIOVAN) 160 MG tablet Take 2 tablets (320 mg total) by mouth daily.     Allergies:   Patient has no known allergies.   Social History    Socioeconomic History   Marital status: Divorced    Spouse name: Not on file   Number of children: Not on file   Years of education: Not on file   Highest education level: Bachelor's degree (e.g., BA, AB, BS)  Occupational History   Not on file  Tobacco Use   Smoking status: Never   Smokeless tobacco: Never  Vaping Use   Vaping status: Never Used  Substance and Sexual Activity   Alcohol use: Yes    Alcohol/week: 1.0 standard drink of alcohol    Types: 1 Glasses of wine per week    Comment: 1 glass 3-4 nights per week   Drug use: Never   Sexual activity: Not on file  Other Topics Concern   Not on file  Social History Narrative   Not on file   Social Drivers of Health   Financial Resource Strain: Low Risk  (08/27/2023)   Overall Financial Resource Strain (CARDIA)    Difficulty of Paying Living Expenses: Not hard at all  Food Insecurity: No Food Insecurity (08/27/2023)   Hunger Vital Sign    Worried About Running Out of Food in the Last Year: Never true    Ran Out of Food in the Last Year: Never true  Transportation Needs: No Transportation Needs (08/27/2023)   PRAPARE - Administrator, Civil Service (Medical): No    Lack of Transportation (  Non-Medical): No  Physical Activity: Insufficiently Active (08/27/2023)   Exercise Vital Sign    Days of Exercise per Week: 3 days    Minutes of Exercise per Session: 40 min  Stress: No Stress Concern Present (08/27/2023)   [employer] of Occupational Health - Occupational Stress Questionnaire    Feeling of Stress : Only a little  Social Connections: Moderately Integrated (08/27/2023)   Social Connection and Isolation Panel [NHANES]    Frequency of Communication with Friends and Family: More than three times a week    Frequency of Social Gatherings with Friends and Family: More than three times a week    Attends Religious Services: Never    Database administrator or Organizations: Yes    Attends Hospital doctor: More than 4 times per year    Marital Status: Married     Family History: The patient's family history includes Bipolar disorder in her brother; Depression in her mother; Heart disease in her maternal grandmother; Lung cancer in her mother; Stomach cancer in her father.  ROS:   Please see the history of present illness.     All other systems reviewed and are negative.  EKGs/Labs/Other Studies Reviewed:    The following studies were reviewed today:       Recent Labs: 02/24/2023: ALT 15; TSH 2.12 08/01/2023: BUN 15; Creatinine, Ser 0.58; Hemoglobin 13.0; Platelets 276; Potassium 3.6; Sodium 130  Recent Lipid Panel    Component Value Date/Time   CHOL 218 (H) 02/24/2023 1022   TRIG 172 (H) 02/24/2023 1022   HDL 70 02/24/2023 1022   CHOLHDL 3.1 02/24/2023 1022   LDLCALC 119 (H) 02/24/2023 1022    Risk Assessment/Calculations:              Physical Exam:    VS:  BP 134/88 (BP Location: Left Arm, Patient Position: Sitting, Cuff Size: Normal)   Pulse 97   Ht 5\' 7"  (1.702 m)   Wt 165 lb 12.8 oz (75.2 kg)   SpO2 97%   BMI 25.97 kg/m     Wt Readings from Last 3 Encounters:  12/08/23 165 lb 12.8 oz (75.2 kg)  10/07/23 171 lb 6.4 oz (77.7 kg)  08/27/23 169 lb 3.2 oz (76.7 kg)     GEN:  Well nourished, well developed in no acute distress HEENT: Normal NECK: No JVD; No carotid bruits CARDIAC: RRR, no murmurs, rubs, gallops RESPIRATORY:  Clear to auscultation without rales, wheezing or rhonchi  MUSCULOSKELETAL:  1+ edema;  SKIN: Warm and dry NEUROLOGIC:  Alert and oriented x 3 PSYCHIATRIC:  Normal affect   ASSESSMENT:    1. Mild ascending aorta dilation (HCC)   2. Primary hypertension    PLAN:    In order of problems listed above:  Mild ascending aorta dilatation, echo 11/24 EF 55 to 60%, mild ascending aorta dilatation measuring 40 mm.  Monitor serially with repeat echoes. Hypertension, BP controlled.  Continue Norvasc 5 mg daily, valsartan 320 mg  daily.  Follow-up in 12 months or as needed.      Medication Adjustments/Labs and Tests Ordered: Current medicines are reviewed at length with the patient today.  Concerns regarding medicines are outlined above.  No orders of the defined types were placed in this encounter.  No orders of the defined types were placed in this encounter.   Patient Instructions  Medication Instructions:   Your physician recommends that you continue on your current medications as directed. Please refer to the Current Medication  list given to you today.  *If you need a refill on your cardiac medications before your next appointment, please call your pharmacy*   Lab Work:  None Ordered  If you have labs (blood work) drawn today and your tests are completely normal, you will receive your results only by: MyChart Message (if you have MyChart) OR A paper copy in the mail If you have any lab test that is abnormal or we need to change your treatment, we will call you to review the results.   Testing/Procedures:  None Ordered   Follow-Up: At [facility], you and your health needs are our priority.  As part of our continuing mission to provide you with exceptional heart care, we have created designated Provider Care Teams.  These Care Teams include your primary Cardiologist (physician) and Advanced Practice Providers (APPs -  Physician Assistants and Nurse Practitioners) who all work together to provide you with the care you need, when you need it.  We recommend signing up for the patient portal called "MyChart".  Sign up information is provided on this After Visit Summary.  MyChart is used to connect with patients for Virtual Visits (Telemedicine).  Patients are able to view lab/test results, encounter notes, upcoming appointments, etc.  Non-urgent messages can be sent to your provider as well.   To learn more about what you can do with MyChart, go to [email or web address].    Your next  appointment:   1 year(s)  Provider:   You may see Debbe Odea, MD or one of the following Advanced Practice Providers on your designated Care Team:   Nicolasa Ducking, NP Eula Listen, PA-C Cadence Fransico Michael, PA-C Charlsie Quest, NP Carlos Levering, NP   Signed, Debbe Odea, MD  12/08/2023 10:28 AM    [facility]

## 2023-12-17 ENCOUNTER — Other Ambulatory Visit: Payer: Self-pay | Admitting: Family Medicine

## 2023-12-17 DIAGNOSIS — I10 Essential (primary) hypertension: Secondary | ICD-10-CM

## 2023-12-17 NOTE — Telephone Encounter (Signed)
 Rx discontinued 10/07/23 due to change in dose. Refill not appropriate.  Requested Prescriptions  Pending Prescriptions Disp Refills   amLODipine (NORVASC) 10 MG tablet [Pharmacy Med Name: AMLODIPINE BESYLATE 10 MG TAB] 90 tablet 1    Sig: TAKE 1 TABLET BY MOUTH EVERY DAY     Cardiovascular: Calcium Channel Blockers 2 Passed - 12/17/2023  3:25 PM      Passed - Last BP in normal range    BP Readings from Last 1 Encounters:  12/08/23 134/88         Passed - Last Heart Rate in normal range    Pulse Readings from Last 1 Encounters:  12/08/23 97         Passed - Valid encounter within last 6 months    Recent Outpatient Visits           3 months ago Benign essential hypertension   [place] [facility], Netta Neat, DO   4 months ago Vasovagal syncope   [place] [facility] Smitty Cords, DO   6 months ago Cervical spinal stenosis   [place] [facility] Smitty Cords, DO   9 months ago Annual physical exam   [place] [facility] Smitty Cords, DO   10 months ago Benign essential hypertension   [place] [facility] [place], Netta Neat, DO       Future Appointments             In 2 months Althea Charon, Netta Neat, DO  [facility], [facility]

## 2024-01-02 ENCOUNTER — Other Ambulatory Visit: Payer: Self-pay

## 2024-01-02 DIAGNOSIS — I10 Essential (primary) hypertension: Secondary | ICD-10-CM

## 2024-01-02 MED ORDER — AMLODIPINE BESYLATE 5 MG PO TABS: 5.0000 mg | ORAL_TABLET | Freq: Every day | ORAL | 3 refills | Status: DC

## 2024-01-02 MED ORDER — VALSARTAN 160 MG PO TABS: 320.0000 mg | ORAL_TABLET | Freq: Every day | ORAL | 3 refills | Status: DC

## 2024-01-02 NOTE — Addendum Note (Signed)
 Addended by: Auburn Bilberry D on: 01/02/2024 04:25 PM   Modules accepted: Orders

## 2024-02-05 ENCOUNTER — Other Ambulatory Visit: Payer: Self-pay | Admitting: Family Medicine

## 2024-02-05 ENCOUNTER — Telehealth: Payer: Self-pay | Admitting: Cardiology

## 2024-02-05 DIAGNOSIS — F411 Generalized anxiety disorder: Secondary | ICD-10-CM

## 2024-02-05 DIAGNOSIS — I10 Essential (primary) hypertension: Secondary | ICD-10-CM

## 2024-02-05 MED ORDER — AMLODIPINE BESYLATE 5 MG PO TABS: 5.0000 mg | ORAL_TABLET | Freq: Every day | ORAL | 3 refills | Status: AC

## 2024-02-05 NOTE — Telephone Encounter (Signed)
*  STAT* If patient is at the pharmacy, call can be transferred to refill team.   1. Which medications need to be refilled? (please list name of each medication and dose if known)  amLODipine (NORVASC) 5 MG tablet   DIFFERENT PHARMACY   4. Which pharmacy/location (including street and city if local pharmacy) is medication to be sent to?   [facility] - [place], [place] - [address] Phone: [phone]  Fax: [phone]     5. Do they need a 30 day or 90 day supply? 90

## 2024-02-05 NOTE — Telephone Encounter (Signed)
 Copied from CRM [id number]. Topic: Clinical - Medication Refill >> Feb 05, 2024 10:15 AM Donald Frost wrote: Most Recent Primary Care Visit:  Provider: Raina Bunting  Department: [id number]  Visit Type: OFFICE VISIT  Date: 08/27/2023  Medication: LORazepam (ATIVAN) 0.5 MG tablet  Has the patient contacted their pharmacy? No   Is this the correct pharmacy for this prescription? Yes If no, delete pharmacy and type the correct one.  This is the patient's preferred pharmacy:  [facility] - [place], [place] - [address] [place] [address] Phone: [phone] Fax: [phone]   Has the prescription been filled recently? No  Is the patient out of the medication? No but she only has a few left and she has been dealing with a lot of trauma lately within the family  Has the patient been seen for an appointment in the last year OR does the patient have an upcoming appointment? Yes  Can we respond through MyChart? Yes  Please assist patient further

## 2024-02-05 NOTE — Telephone Encounter (Signed)
Refill sent to the pharmacy electronically. Patient made aware. 

## 2024-02-06 MED ORDER — LORAZEPAM 0.5 MG PO TABS: 0.5000 mg | ORAL_TABLET | Freq: Every day | ORAL | 0 refills | Status: DC | PRN

## 2024-02-06 NOTE — Telephone Encounter (Signed)
 Requested medication (s) are due for refill today - unsure  Requested medication (s) are on the active medication list -yes  Future visit scheduled -yes   Last refill: 02/24/23 #20  Notes to clinic: non delegated Rx  Requested Prescriptions  Pending Prescriptions Disp Refills   LORazepam  (ATIVAN ) 0.5 MG tablet 20 tablet 0    Sig: Take 1 tablet (0.5 mg total) by mouth daily as needed for anxiety.     Not Delegated - Psychiatry: Anxiolytics/Hypnotics 2 Failed - 02/06/2024  8:18 AM      Failed - This refill cannot be delegated      Failed - Urine Drug Screen completed in last 360 days      Failed - Valid encounter within last 6 months    Recent Outpatient Visits   None     Future Appointments             In 3 weeks Karamalegos, Kayleen Party, DO [place] [facility]            Passed - Patient is not pregnant         Requested Prescriptions  Pending Prescriptions Disp Refills   LORazepam  (ATIVAN ) 0.5 MG tablet 20 tablet 0    Sig: Take 1 tablet (0.5 mg total) by mouth daily as needed for anxiety.     Not Delegated - Psychiatry: Anxiolytics/Hypnotics 2 Failed - 02/06/2024  8:18 AM      Failed - This refill cannot be delegated      Failed - Urine Drug Screen completed in last 360 days      Failed - Valid encounter within last 6 months    Recent Outpatient Visits   None     Future Appointments             In 3 weeks [place], Kayleen Party, DO [place] [facility], [facility]            Passed - Patient is not pregnant

## 2024-02-24 ENCOUNTER — Other Ambulatory Visit: Payer: Self-pay

## 2024-02-24 DIAGNOSIS — E7800 Pure hypercholesterolemia, unspecified: Secondary | ICD-10-CM

## 2024-02-24 DIAGNOSIS — R7309 Other abnormal glucose: Secondary | ICD-10-CM

## 2024-02-24 DIAGNOSIS — I10 Essential (primary) hypertension: Secondary | ICD-10-CM

## 2024-02-24 DIAGNOSIS — Z0000 Encounter for general adult medical examination without abnormal findings: Secondary | ICD-10-CM

## 2024-02-25 LAB — COMPLETE METABOLIC PANEL WITHOUT GFR
AG Ratio: 2.1 (calc) (ref 1.0–2.5)
ALT: 17 U/L (ref 6–29)
AST: 18 U/L (ref 10–35)
Albumin: 4.5 g/dL (ref 3.6–5.1)
Alkaline phosphatase (APISO): 55 U/L (ref 37–153)
BUN: 21 mg/dL (ref 7–25)
CO2: 30 mmol/L (ref 20–32)
Calcium: 9.6 mg/dL (ref 8.6–10.4)
Chloride: 102 mmol/L (ref 98–110)
Creat: 0.7 mg/dL (ref 0.60–1.00)
Globulin: 2.1 g/dL (ref 1.9–3.7)
Glucose, Bld: 99 mg/dL (ref 65–99)
Potassium: 4.7 mmol/L (ref 3.5–5.3)
Sodium: 137 mmol/L (ref 135–146)
Total Bilirubin: 0.4 mg/dL (ref 0.2–1.2)
Total Protein: 6.6 g/dL (ref 6.1–8.1)

## 2024-02-25 LAB — LIPID PANEL
Cholesterol: 203 mg/dL — ABNORMAL HIGH (ref <200)
HDL: 70 mg/dL (ref >=50)
LDL Cholesterol (Calc): 110 mg/dL — ABNORMAL HIGH
Non-HDL Cholesterol (Calc): 133 mg/dL — ABNORMAL HIGH (ref <130)
Total CHOL/HDL Ratio: 2.9 (calc) (ref <5.0)
Triglycerides: 120 mg/dL (ref <150)

## 2024-02-25 LAB — HEMOGLOBIN A1C
Hgb A1c MFr Bld: 6.2 % — ABNORMAL HIGH (ref <5.7)
Mean Plasma Glucose: 131 mg/dL
eAG (mmol/L): 7.3 mmol/L

## 2024-02-25 LAB — CBC WITH DIFFERENTIAL/PLATELET
Absolute Lymphocytes: 2051 {cells}/uL (ref 850–3900)
Absolute Monocytes: 781 {cells}/uL (ref 200–950)
Basophils Absolute: 58 {cells}/uL (ref 0–200)
Basophils Relative: 0.8 %
Eosinophils Absolute: 183 {cells}/uL (ref 15–500)
Eosinophils Relative: 2.5 %
HCT: 40.4 % (ref 35.0–45.0)
Hemoglobin: 12.9 g/dL (ref 11.7–15.5)
MCH: 26.8 pg — ABNORMAL LOW (ref 27.0–33.0)
MCHC: 31.9 g/dL — ABNORMAL LOW (ref 32.0–36.0)
MCV: 83.8 fL (ref 80.0–100.0)
MPV: 10.3 fL (ref 7.5–12.5)
Monocytes Relative: 10.7 %
Neutro Abs: 4227 {cells}/uL (ref 1500–7800)
Neutrophils Relative %: 57.9 %
Platelets: 266 10*3/uL (ref 140–400)
RBC: 4.82 10*6/uL (ref 3.80–5.10)
RDW: 13.5 % (ref 11.0–15.0)
Total Lymphocyte: 28.1 %
WBC: 7.3 10*3/uL (ref 3.8–10.8)

## 2024-02-25 LAB — TSH: TSH: 2.1 m[IU]/L (ref 0.40–4.50)

## 2024-03-02 ENCOUNTER — Encounter: Payer: Self-pay | Admitting: Family Medicine

## 2024-03-02 ENCOUNTER — Ambulatory Visit (INDEPENDENT_AMBULATORY_CARE_PROVIDER_SITE_OTHER): Payer: Self-pay | Admitting: Family Medicine

## 2024-03-02 VITALS — BP 122/80 | HR 72 | Ht 67.0 in | Wt 172.2 lb

## 2024-03-02 DIAGNOSIS — Z0000 Encounter for general adult medical examination without abnormal findings: Secondary | ICD-10-CM

## 2024-03-02 DIAGNOSIS — I10 Essential (primary) hypertension: Secondary | ICD-10-CM

## 2024-03-02 MED ORDER — VALSARTAN 320 MG PO TABS: 320.0000 mg | ORAL_TABLET | Freq: Every day | ORAL | 1 refills | Status: DC

## 2024-03-02 NOTE — Progress Notes (Signed)
 Subjective:    Patient ID: Brenda Caldwell, female    DOB: 1951-07-12, 73 y.o.   MRN: [id number]  Brenda Caldwell is a 73 y.o. female presenting on 03/02/2024 for Annual Exam   HPI  Discussed the use of AI scribe software for clinical note transcription with the patient, who gave verbal consent to proceed.  History of Present Illness   Brenda Caldwell is a 73 year old female who presents for an annual physical exam.  She has experienced a weight gain of one to two pounds since her last visit in December, despite maintaining a diet that includes cabbage, sweet potatoes, and protein bars, and engaging in regular physical activity such as walking at least twelve miles a week and going to the gym. She is frustrated as her clothes have moved up to a larger size. She notes that she was more active when caring for her husband and daughter, who both passed away earlier this year, which has been a significant source of stress.  Her blood work from May 6th shows an A1c of 6.2, which is slightly higher than her previous results. Her cholesterol levels have improved, with LDL at 110. Other blood chemistry, including liver and kidney function, is normal.  She is currently taking amlodipine , which was reduced from 10 mg to 5 mg, and valsartan , which she believes should be taken as two tablets 160mg  x 2 = 320mg .   She also takes a medication for stomach acid, which she does not feel is necessary as she never complained of stomach issues, and it was initially prescribed to address a cough that has since improved.  She has experienced significant stress due to the recent deaths of her husband and daughter. She has a large family support system, including her son, grandson, and daughter's husband and grandchildren. 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 She maintains an active lifestyle, walking twelve miles a week and going to the gym.     Chronic Cough Resolving Followed [facility] Pulmonology Dr Jaclynn Mast Issue of neck fullness swelling - No further concerns at this time. This issue was unresolved but pulmonology ruled out significant problems.   Anxiety On Escitalopram  10mg  daily Rarely taking Lorazepam  0.5mg  AS NEEDED. Needs re order   CHRONIC HTN: Controlled Current Meds - Valsartan  160mg  x 2 daily, Amlodipine  5mg  daily Reports good compliance, took meds today. Tolerating well, w/o complaints. Denies CP, dyspnea, HA, edema, dizziness / lightheadedness     Health Maintenance: Updated Colonoscopy 07/30/19, negative no polyps, repeat 5 yr Next due 07/2024      03/02/2024    9:05 AM 08/27/2023    9:58 AM 08/11/2023    8:39 AM  Depression screen PHQ 2/9  Decreased Interest 0 1 0  Down, Depressed, Hopeless  0 0  PHQ - 2 Score 0 1 0  Altered sleeping 1 0 0  Tired, decreased energy  0 0  Change in appetite 0 0 0  Feeling bad or failure about yourself  0 0 0  Trouble concentrating 0 0 0  Moving slowly or fidgety/restless 0 0 0  Suicidal thoughts 0 0 0  PHQ-9 Score 1 1 0  Difficult doing work/chores Not difficult at all Not difficult at all Not difficult at all       03/02/2024    9:05 AM 08/27/2023    9:58 AM 08/11/2023    8:39 AM 06/12/2023    9:26 AM  GAD 7 : Generalized Anxiety Score  Nervous, Anxious, on Edge 0 1 0 1  Control/stop worrying 0 0 0 0  Worry too much - different things 0 1 0 1  Trouble relaxing 0 0 0 0  Restless 0 0 0 0  Easily annoyed or irritable 0 0 0 0  Afraid - awful might happen 0 0 0 1  Total GAD 7 Score 0 2 0 3  Anxiety Difficulty    Somewhat difficult     Past Medical History:  Diagnosis Date   Anxiety    Hypertension    Past Surgical History:  Procedure Laterality Date   bladder tack  2000   TOTAL ABDOMINAL HYSTERECTOMY  2000   Social History   Socioeconomic History   Marital status: Divorced    Spouse name: Not on file   Number of children: Not on file   Years of education: Not on file   Highest  education level: Bachelor's degree (e.g., BA, AB, BS)  Occupational History   Not on file  Tobacco Use   Smoking status: Never   Smokeless tobacco: Never  Vaping Use   Vaping status: Never Used  Substance and Sexual Activity   Alcohol use: Yes    Alcohol/week: 1.0 standard drink of alcohol    Types: 1 Glasses of wine per week    Comment: 1 glass 3-4 nights per week   Drug use: Never   Sexual activity: Not on file  Other Topics Concern   Not on file  Social History Narrative   Not on file   Social Drivers of Health   Financial Resource Strain: Low Risk  (08/27/2023)   Overall Financial Resource Strain (CARDIA)    Difficulty of Paying Living Expenses: Not hard at all  Food Insecurity: No Food Insecurity (08/27/2023)   Hunger Vital Sign    Worried About Running Out of Food in the Last Year: Never true    Ran Out of Food in the Last Year: Never true  Transportation Needs: No Transportation Needs (08/27/2023)   PRAPARE - Administrator, Civil Service (Medical): No    Lack of Transportation (Non-Medical): No  Physical Activity: Insufficiently Active (08/27/2023)   Exercise Vital Sign    Days of Exercise per Week: 3 days    Minutes of Exercise per Session: 40 min  Stress: No Stress Concern Present (08/27/2023)   [employer] of Occupational Health - Occupational Stress Questionnaire    Feeling of Stress : Only a little  Social Connections: Moderately Integrated (08/27/2023)   Social Connection and Isolation Panel [NHANES]    Frequency of Communication with Friends and Family: More than three times a week    Frequency of Social Gatherings with Friends and Family: More than three times a week    Attends Religious Services: Never    Database administrator or Organizations: Yes    Attends Engineer, structural: More than 4 times per year    Marital Status: Married  Catering manager Violence: Not At Risk (05/29/2023)   Humiliation, Afraid, Rape, and Kick  questionnaire    Fear of Current or Ex-Partner: No    Emotionally Abused: No    Physically Abused: No    Sexually Abused: No   Family History  Problem Relation Age of Onset   Lung cancer Mother    Depression Mother    Stomach cancer Father    Bipolar disorder Brother    Heart disease Maternal Grandmother  Current Outpatient Medications on File Prior to Visit  Medication Sig   amLODipine  (NORVASC ) 5 MG tablet Take 1 tablet (5 mg total) by mouth daily.   escitalopram  (LEXAPRO ) 10 MG tablet Take 1 tablet (10 mg total) by mouth daily.   fluticasone (FLONASE) 50 MCG/ACT nasal spray Place 2 sprays into both nostrils daily.   LORazepam  (ATIVAN ) 0.5 MG tablet Take 1 tablet (0.5 mg total) by mouth daily as needed for anxiety.   No current facility-administered medications on file prior to visit.    Review of Systems  Constitutional:  Negative for activity change, appetite change, chills, diaphoresis, fatigue and fever.  HENT:  Negative for congestion and hearing loss.   Eyes:  Negative for visual disturbance.  Respiratory:  Negative for cough, chest tightness, shortness of breath and wheezing.   Cardiovascular:  Negative for chest pain, palpitations and leg swelling.  Gastrointestinal:  Negative for abdominal pain, constipation, diarrhea, nausea and vomiting.  Genitourinary:  Negative for dysuria, frequency and hematuria.  Musculoskeletal:  Negative for arthralgias and neck pain.  Skin:  Negative for rash.  Neurological:  Negative for dizziness, weakness, light-headedness, numbness and headaches.  Hematological:  Negative for adenopathy.  Psychiatric/Behavioral:  Negative for behavioral problems, dysphoric mood and sleep disturbance.    Per HPI unless specifically indicated above     Objective:     BP 122/80 (BP Location: Right Arm, Patient Position: Sitting, Cuff Size: Normal)   Pulse 72   Ht 5\' 7"  (1.702 m)   Wt 172 lb 4 oz (78.1 kg)   SpO2 96%   BMI 26.98 kg/m   Wt  Readings from Last 3 Encounters:  03/02/24 172 lb 4 oz (78.1 kg)  12/08/23 165 lb 12.8 oz (75.2 kg)  10/07/23 171 lb 6.4 oz (77.7 kg)    Physical Exam Vitals and nursing note reviewed.  Constitutional:      General: She is not in acute distress.    Appearance: She is well-developed. She is not diaphoretic.     Comments: Well-appearing, comfortable, cooperative  HENT:     Head: Normocephalic and atraumatic.  Eyes:     General:        Right eye: No discharge.        Left eye: No discharge.     Conjunctiva/sclera: Conjunctivae normal.     Pupils: Pupils are equal, round, and reactive to light.  Neck:     Thyroid: No thyromegaly.     Vascular: No carotid bruit.  Cardiovascular:     Rate and Rhythm: Normal rate and regular rhythm.     Pulses: Normal pulses.     Heart sounds: Normal heart sounds. No murmur heard. Pulmonary:     Effort: Pulmonary effort is normal. No respiratory distress.     Breath sounds: Normal breath sounds. No wheezing or rales.  Abdominal:     General: Bowel sounds are normal. There is no distension.     Palpations: Abdomen is soft. There is no mass.     Tenderness: There is no abdominal tenderness.  Musculoskeletal:        General: No tenderness. Normal range of motion.     Cervical back: Normal range of motion and neck supple.     Right lower leg: No edema.     Left lower leg: No edema.     Comments: Upper / Lower Extremities: - Normal muscle tone, strength bilateral upper extremities 5/5, lower extremities 5/5  Lymphadenopathy:     Cervical: No cervical  adenopathy.  Skin:    General: Skin is warm and dry.     Findings: No erythema or rash.  Neurological:     Mental Status: She is alert and oriented to person, place, and time.     Comments: Distal sensation intact to light touch all extremities  Psychiatric:        Mood and Affect: Mood normal.        Behavior: Behavior normal.        Thought Content: Thought content normal.     Comments: Well  groomed, good eye contact, normal speech and thoughts     Results for orders placed or performed in visit on 02/24/24  TSH   Collection Time: 02/24/24  8:15 AM  Result Value Ref Range   TSH 2.10 0.40 - 4.50 mIU/L  CBC with Differential/Platelet   Collection Time: 02/24/24  8:15 AM  Result Value Ref Range   WBC 7.3 3.8 - 10.8 Thousand/uL   RBC 4.82 3.80 - 5.10 Million/uL   Hemoglobin 12.9 11.7 - 15.5 g/dL   HCT 16.1 09.6 - 04.5 %   MCV 83.8 80.0 - 100.0 fL   MCH 26.8 (L) 27.0 - 33.0 pg   MCHC 31.9 (L) 32.0 - 36.0 g/dL   RDW 40.9 81.1 - 91.4 %   Platelets 266 140 - 400 Thousand/uL   MPV 10.3 7.5 - 12.5 fL   Neutro Abs 4,227 1,500 - 7,800 cells/uL   Absolute Lymphocytes 2,051 850 - 3,900 cells/uL   Absolute Monocytes 781 200 - 950 cells/uL   Eosinophils Absolute 183 15 - 500 cells/uL   Basophils Absolute 58 0 - 200 cells/uL   Neutrophils Relative % 57.9 %   Total Lymphocyte 28.1 %   Monocytes Relative 10.7 %   Eosinophils Relative 2.5 %   Basophils Relative 0.8 %  COMPLETE METABOLIC PANEL WITH GFR   Collection Time: 02/24/24  8:15 AM  Result Value Ref Range   Glucose, Bld 99 65 - 99 mg/dL   BUN 21 7 - 25 mg/dL   Creat 7.82 9.56 - 2.13 mg/dL   BUN/Creatinine Ratio SEE NOTE: 6 - 22 (calc)   Sodium 137 135 - 146 mmol/L   Potassium 4.7 3.5 - 5.3 mmol/L   Chloride 102 98 - 110 mmol/L   CO2 30 20 - 32 mmol/L   Calcium 9.6 8.6 - 10.4 mg/dL   Total Protein 6.6 6.1 - 8.1 g/dL   Albumin 4.5 3.6 - 5.1 g/dL   Globulin 2.1 1.9 - 3.7 g/dL (calc)   AG Ratio 2.1 1.0 - 2.5 (calc)   Total Bilirubin 0.4 0.2 - 1.2 mg/dL   Alkaline phosphatase (APISO) 55 37 - 153 U/L   AST 18 10 - 35 U/L   ALT 17 6 - 29 U/L  Lipid panel   Collection Time: 02/24/24  8:15 AM  Result Value Ref Range   Cholesterol 203 (H) <200 mg/dL   HDL 70 > OR = 50 mg/dL   Triglycerides 086 <578 mg/dL   LDL Cholesterol (Calc) 110 (H) mg/dL (calc)   Total CHOL/HDL Ratio 2.9 <5.0 (calc)   Non-HDL Cholesterol (Calc)  133 (H) <130 mg/dL (calc)  Hemoglobin I6N   Collection Time: 02/24/24  8:15 AM  Result Value Ref Range   Hgb A1c MFr Bld 6.2 (H) <5.7 %   Mean Plasma Glucose 131 mg/dL   eAG (mmol/L) 7.3 mmol/L      Assessment & Plan:   Problem List Items Addressed This Visit  Benign essential hypertension   Relevant Medications   valsartan  (DIOVAN ) 320 MG tablet   Other Visit Diagnoses       Annual physical exam    -  Primary        Updated Health Maintenance information Reviewed recent lab results with patient Encouraged improvement to lifestyle with diet and exercise Goal of weight loss  Hypertension Updated her meds. New rx Valsartan  320mg  daily, prior 160 x 2 Added refills to rx Amlodipine  5mg   Weight gain Weight increased by one pound since December. Current weight 172 pounds. BMI nearly normal.  Weight loss medication not indicated as we discussed - Continue current diet and exercise regimen. - Monitor weight regularly. Reassurance and encouragement provided  Prediabetes A1c is 6.2, indicating prediabetes. Management focuses on diet and exercise. No specific medication for prediabetes, only for diabetes if it progresses. - Continue current diet and exercise regimen. - Monitor A1c levels regularly. - Consider medication if A1c increases significantly.        No orders of the defined types were placed in this encounter.   Meds ordered this encounter  Medications   valsartan  (DIOVAN ) 320 MG tablet    Sig: Take 1 tablet (320 mg total) by mouth daily.    Dispense:  90 tablet    Refill:  1    Switch dose to 320mg  instead of two of the 160.     Follow up plan: Return for 6 month PreDM A1c, Weight, BP.  Domingo Friend, DO [facility] 03/02/2024, 9:17 AM

## 2024-03-02 NOTE — Patient Instructions (Addendum)
 Thank you for coming to the office today.  Keep up improving with diet and exercise Weight seems to be maintained not worse. We can consider options in future  Taper down on Omeprazole  20mg , can take every other day for a week or two and then phase off of it when ready.  Keep on Flonase  Recent Labs    08/27/23 1030 02/24/24 0815  HGBA1C 6.0* 6.2*   Monitoring A1c sugar, it is in Pre Diabetic range. Not a significant concern.  For Mammogram screening for breast cancer   Call the Imaging Center below anytime to schedule your own appointment now that order has been placed.  [facility] [address] [place], [place] [address] Phone: [phone]   Please schedule a Follow-up Appointment to: Return for 6 month PreDM A1c, Weight, BP.  If you have any other questions or concerns, please feel free to call the office or send a message through MyChart. You may also schedule an earlier appointment if necessary.  Additionally, you may be receiving a survey about your experience at our office within a few days to 1 week by e-mail or mail. We value your feedback.  Domingo Friend, DO [facility], [place]

## 2024-03-22 ENCOUNTER — Other Ambulatory Visit: Payer: Self-pay | Admitting: Family Medicine

## 2024-03-22 DIAGNOSIS — F411 Generalized anxiety disorder: Secondary | ICD-10-CM

## 2024-03-23 NOTE — Telephone Encounter (Signed)
 Requested Prescriptions  Pending Prescriptions Disp Refills   escitalopram  (LEXAPRO ) 10 MG tablet [Pharmacy Med Name: ESCITALOPRAM  10 MG TABLET] 90 tablet 1    Sig: TAKE 1 TABLET BY MOUTH EVERY DAY.     Psychiatry:  Antidepressants - SSRI Passed - 03/23/2024 12:15 PM      Passed - Valid encounter within last 6 months    Recent Outpatient Visits           3 weeks ago Annual physical exam   [facility], [place], [place]

## 2024-06-03 ENCOUNTER — Ambulatory Visit: Payer: Medicare PPO

## 2024-06-03 VITALS — Ht 67.0 in | Wt 172.0 lb

## 2024-06-03 DIAGNOSIS — Z1231 Encounter for screening mammogram for malignant neoplasm of breast: Secondary | ICD-10-CM

## 2024-06-03 DIAGNOSIS — Z0000 Encounter for general adult medical examination without abnormal findings: Secondary | ICD-10-CM | POA: Diagnosis not present

## 2024-06-03 DIAGNOSIS — Z1211 Encounter for screening for malignant neoplasm of colon: Secondary | ICD-10-CM

## 2024-06-03 NOTE — Patient Instructions (Addendum)
 Brenda Caldwell , Thank you for taking time out of your busy schedule to complete your Annual Wellness Visit with me. I enjoyed our conversation and look forward to speaking with you again next year. I, as well as your care team,  appreciate your ongoing commitment to your health goals. Please review the following plan we discussed and let me know if I can assist you in the future. Your Game plan/ To Do List    Referrals: If you haven't heard from the office you've been referred to, please reach out to them at the phone provided.   Follow up Visits: We will see or speak with you next year for your Next Medicare AWV with our clinical staff 06/17/25 @ 10:10a  Have you seen your provider in the last 6 months (3 months if uncontrolled diabetes)?  Next appointment with provider 09/02/24 @ 9a  Clinician Recommendations:  Aim for 30 minutes of exercise or brisk walking, 6-8 glasses of water, and 5 servings of fruits and vegetables each day.       This is a list of the screenings recommended for you:  Health Maintenance  Topic Date Due   COVID-19 Vaccine (1) Never done   Hepatitis C Screening  Never done   Zoster (Shingles) Vaccine (1 of 2) Never done   DEXA scan (bone density measurement)  Never done   Mammogram  03/19/2024   Flu Shot  05/21/2024   Colon Cancer Screening  07/29/2024   DTaP/Tdap/Td vaccine (1 - Tdap) 08/26/2024*   Medicare Annual Wellness Visit  06/03/2025   Pneumococcal Vaccine for age over 74  Completed   HPV Vaccine  Aged Out   Meningitis B Vaccine  Aged Out  *Topic was postponed. The date shown is not the original due date.    Advanced directives: (Copy Requested) Please bring a copy of your health care power of attorney and living will to the office to be added to your chart at your convenience. You can mail to [facility] [address] [place], [place] [address] or email to [email or web address] Advance Care Planning is important because it:  [x]   Makes sure you receive the medical care that is consistent with your values, goals, and preferences  [x]  It provides guidance to your family and loved ones and reduces their decisional burden about whether or not they are making the right decisions based on your wishes.  Follow the link provided in your after visit summary or read over the paperwork we have mailed to you to help you started getting your Advance Directives in place. If you need assistance in completing these, please reach out to us  so that we can help you!  See attachments for Preventive Care and Fall Prevention Tips.

## 2024-06-03 NOTE — Progress Notes (Signed)
 Subjective:   Brenda Caldwell is a 73 y.o. who presents for a Medicare Wellness preventive visit.  As a reminder, Annual Wellness Visits don't include a physical exam, and some assessments may be limited, especially if this visit is performed virtually. We may recommend an in-person follow-up visit with your provider if needed.  Visit Complete: Virtual I connected with  Brenda Caldwell on 06/03/24 by a audio enabled telemedicine application and verified that I am speaking with the correct person using two identifiers.  Patient Location: Home  Provider Location: Home Office  I discussed the limitations of evaluation and management by telemedicine. The patient expressed understanding and agreed to proceed.  Vital Signs: Because this visit was a virtual/telehealth visit, some criteria may be missing or patient reported. Any vitals not documented were not able to be obtained and vitals that have been documented are patient reported.    Persons Participating in Visit: Patient.  AWV Questionnaire: No: Patient Medicare AWV questionnaire was not completed prior to this visit.  Cardiac Risk Factors include: advanced age (>66men, >49 women);hypertension     Objective:    Today's Vitals   06/03/24 1014  Weight: 172 lb (78 kg)  Height: 5' 7 (1.702 m)   Body mass index is 26.94 kg/m.     06/03/2024   10:21 AM 08/01/2023    9:54 AM 05/29/2023   10:36 AM  Advanced Directives  Does Patient Have a Medical Advance Directive? Yes No No  Type of Estate agent of [place];Living will    Copy of Healthcare Power of Attorney in Chart? No - copy requested    Would patient like information on creating a medical advance directive?  No - Patient declined No - Patient declined    Current Medications (verified) Outpatient Encounter Medications as of 06/03/2024  Medication Sig   amLODipine  (NORVASC ) 5 MG tablet Take 1 tablet (5 mg total) by mouth daily.   escitalopram  (LEXAPRO )  10 MG tablet TAKE 1 TABLET BY MOUTH EVERY DAY.   fluticasone (FLONASE) 50 MCG/ACT nasal spray Place 2 sprays into both nostrils daily.   LORazepam  (ATIVAN ) 0.5 MG tablet Take 1 tablet (0.5 mg total) by mouth daily as needed for anxiety.   valsartan  (DIOVAN ) 320 MG tablet Take 1 tablet (320 mg total) by mouth daily.   No facility-administered encounter medications on file as of 06/03/2024.    Allergies (verified) Patient has no known allergies.   History: Past Medical History:  Diagnosis Date   Anxiety    Hypertension    Past Surgical History:  Procedure Laterality Date   bladder tack  2000   TOTAL ABDOMINAL HYSTERECTOMY  2000   Family History  Problem Relation Age of Onset   Lung cancer Mother    Depression Mother    Stomach cancer Father    Bipolar disorder Brother    Heart disease Maternal Grandmother    Social History   Socioeconomic History   Marital status: Divorced    Spouse name: Not on file   Number of children: Not on file   Years of education: Not on file   Highest education level: Bachelor's degree (e.g., BA, AB, BS)  Occupational History   Not on file  Tobacco Use   Smoking status: Never   Smokeless tobacco: Never  Vaping Use   Vaping status: Never Used  Substance and Sexual Activity   Alcohol use: Yes    Alcohol/week: 1.0 standard drink of alcohol    Types: 1  Glasses of wine per week    Comment: 1 glass 3-4 nights per week   Drug use: Never   Sexual activity: Not on file  Other Topics Concern   Not on file  Social History Narrative   Not on file   Social Drivers of Health   Financial Resource Strain: Low Risk  (06/03/2024)   Overall Financial Resource Strain (CARDIA)    Difficulty of Paying Living Expenses: Not hard at all  Food Insecurity: No Food Insecurity (06/03/2024)   Hunger Vital Sign    Worried About Running Out of Food in the Last Year: Never true    Ran Out of Food in the Last Year: Never true  Transportation Needs: No  Transportation Needs (06/03/2024)   PRAPARE - Administrator, Civil Service (Medical): No    Lack of Transportation (Non-Medical): No  Physical Activity: Sufficiently Active (06/03/2024)   Exercise Vital Sign    Days of Exercise per Week: 4 days    Minutes of Exercise per Session: 50 min  Stress: No Stress Concern Present (06/03/2024)   [employer] of Occupational Health - Occupational Stress Questionnaire    Feeling of Stress: Not at all  Recent Concern: Stress - Stress Concern Present (06/02/2024)   [employer] of Occupational Health - Occupational Stress Questionnaire    Feeling of Stress: To some extent  Social Connections: Socially Isolated (06/03/2024)   Social Connection and Isolation Panel    Frequency of Communication with Friends and Family: More than three times a week    Frequency of Social Gatherings with Friends and Family: More than three times a week    Attends Religious Services: Never    Database administrator or Organizations: No    Attends Banker Meetings: Never    Marital Status: Widowed    Tobacco Counseling Counseling given: Not Answered    Clinical Intake:  Pre-visit preparation completed: Yes  Pain : No/denies pain     BMI - recorded: 26.94 Nutritional Status: BMI 25 -29 Overweight Nutritional Risks: None Diabetes: No  Lab Results  Component Value Date   HGBA1C 6.2 (H) 02/24/2024   HGBA1C 6.0 (A) 08/27/2023   HGBA1C 6.1 (H) 02/24/2023     How often do you need to have someone help you when you read instructions, pamphlets, or other written materials from your doctor or pharmacy?: 1 - Never  Interpreter Needed?: No  Information entered by :: Rojelio Blush LPN   Activities of Daily Living     06/03/2024   10:20 AM 08/27/2023    9:59 AM  In your present state of health, do you have any difficulty performing the following activities:  Hearing? 0 1  Vision? 0   Difficulty concentrating or making  decisions? 0 0  Walking or climbing stairs? 0 0  Dressing or bathing? 0 0  Doing errands, shopping? 0 0  Preparing Food and eating ? N   Using the Toilet? N   In the past six months, have you accidently leaked urine? N   Do you have problems with loss of bowel control? N   Managing your Medications? N   Managing your Finances? N   Housekeeping or managing your Housekeeping? N     Patient Care Team: Edman Marsa PARAS, DO as PCP - General (Family Medicine) Darliss Rogue, MD as PCP - Cardiology (Cardiology)  I have updated your Care Teams any recent Medical Services you may have received from other providers in the  past year.     Assessment:   This is a routine wellness examination for Brenda Caldwell.  Hearing/Vision screen Hearing Screening - Comments:: Denies hearing difficulties   Vision Screening - Comments:: Wears rx glasses - up to date with routine eye exams with  Dr Mevelyn   Goals Addressed               This Visit's Progress     Increase physical activity (pt-stated)        Remain Active.       Depression Screen     06/03/2024   10:18 AM 03/02/2024    9:05 AM 08/27/2023    9:58 AM 08/11/2023    8:39 AM 06/12/2023    9:26 AM 05/29/2023   10:34 AM 02/24/2023    9:25 AM  PHQ 2/9 Scores  PHQ - 2 Score 0 0 1 0 1 0 0  PHQ- 9 Score  1 1 0 2 0 0    Fall Risk     06/03/2024   10:21 AM 08/27/2023    9:59 AM 08/11/2023    8:39 AM 06/12/2023    9:26 AM 05/29/2023   10:37 AM  Fall Risk   Falls in the past year? 0 1 1 0 0  Number falls in past yr: 0 0 1 0 0  Injury with Fall? 0 1 1 0 0  Risk for fall due to : No Fall Risks   No Fall Risks No Fall Risks  Follow up Falls evaluation completed   Falls evaluation completed Falls prevention discussed;Falls evaluation completed    MEDICARE RISK AT HOME:  Medicare Risk at Home Any stairs in or around the home?: Yes If so, are there any without handrails?: No Home free of loose throw rugs in walkways, pet beds,  electrical cords, etc?: Yes Adequate lighting in your home to reduce risk of falls?: Yes Life alert?: No Use of a cane, walker or w/c?: No Grab bars in the bathroom?: Yes Shower chair or bench in shower?: Yes Elevated toilet seat or a handicapped toilet?: No  TIMED UP AND GO:  Was the test performed?  No  Cognitive Function: 6CIT completed        06/03/2024   10:21 AM 05/29/2023   10:38 AM  6CIT Screen  What Year? 0 points 0 points  What month? 0 points 0 points  What time? 0 points 0 points  Count back from 20 0 points 0 points  Months in reverse 0 points 0 points  Repeat phrase 0 points 0 points  Total Score 0 points 0 points    Immunizations Immunization History  Administered Date(s) Administered   Fluad Trivalent(High Dose 65+) 08/27/2023   Influenza Inj Mdck Quad Pf 07/28/2018, 07/25/2022   Influenza, High Dose Seasonal PF 08/03/2020   PNEUMOCOCCAL CONJUGATE-20 02/24/2023   Pneumococcal Polysaccharide-23 01/04/2019    Screening Tests Health Maintenance  Topic Date Due   COVID-19 Vaccine (1) Never done   Hepatitis C Screening  Never done   Zoster Vaccines- Shingrix  (1 of 2) Never done   DEXA SCAN  Never done   MAMMOGRAM  03/19/2024   INFLUENZA VACCINE  05/21/2024   Colonoscopy  07/29/2024   DTaP/Tdap/Td (1 - Tdap) 08/26/2024 (Originally 07/10/1970)   Medicare Annual Wellness (AWV)  06/03/2025   Pneumococcal Vaccine: 50+ Years  Completed   HPV VACCINES  Aged Out   Meningococcal B Vaccine  Aged Out    Health Maintenance  Health Maintenance Due  Topic  Date Due   COVID-19 Vaccine (1) Never done   Hepatitis C Screening  Never done   Zoster Vaccines- Shingrix  (1 of 2) Never done   DEXA SCAN  Never done   MAMMOGRAM  03/19/2024   INFLUENZA VACCINE  05/21/2024   Colonoscopy  07/29/2024   Health Maintenance Items Addressed: Mammogram ordered, DEXA scheduled, Referral sent to GI for colonoscopy  Additional Screening:  Vision Screening: Recommended annual  ophthalmology exams for early detection of glaucoma and other disorders of the eye. Would you like a referral to an eye doctor? No    Dental Screening: Recommended annual dental exams for proper oral hygiene  Community Resource Referral / Chronic Care Management: CRR required this visit?  No   CCM required this visit?  No   Plan:    I have personally reviewed and noted the following in the patient's chart:   Medical and social history Use of alcohol, tobacco or illicit drugs  Current medications and supplements including opioid prescriptions. Patient is not currently taking opioid prescriptions. Functional ability and status Nutritional status Physical activity Advanced directives List of other physicians Hospitalizations, surgeries, and ER visits in previous 12 months Vitals Screenings to include cognitive, depression, and falls Referrals and appointments  In addition, I have reviewed and discussed with patient certain preventive protocols, quality metrics, and best practice recommendations. A written personalized care plan for preventive services as well as general preventive health recommendations were provided to patient.   Rojelio LELON Blush, LPN   1/85/7974   After Visit Summary: (MyChart) Due to this being a telephonic visit, the after visit summary with patients personalized plan was offered to patient via MyChart   Notes: Nothing significant to report at this time.

## 2024-07-12 ENCOUNTER — Encounter: Payer: Self-pay | Admitting: Family Medicine

## 2024-07-12 ENCOUNTER — Other Ambulatory Visit: Payer: Self-pay | Admitting: Family Medicine

## 2024-07-12 ENCOUNTER — Telehealth (INDEPENDENT_AMBULATORY_CARE_PROVIDER_SITE_OTHER): Admitting: Family Medicine

## 2024-07-12 ENCOUNTER — Telehealth: Payer: Self-pay

## 2024-07-12 DIAGNOSIS — F411 Generalized anxiety disorder: Secondary | ICD-10-CM

## 2024-07-12 MED ORDER — LORAZEPAM 0.5 MG PO TABS: 0.5000 mg | ORAL_TABLET | Freq: Three times a day (TID) | ORAL | 0 refills | Status: AC | PRN

## 2024-07-12 NOTE — Telephone Encounter (Signed)
 Copied from CRM #[id number]. Topic: Clinical - Medication Question >> Jul 12, 2024 10:25 AM Emylou G wrote: Reason for CRM: Patient wants to know if she can get ambian going on a long flight to [place]

## 2024-07-12 NOTE — Patient Instructions (Signed)
 Rx sent to pharmacy.  Please schedule a Follow-up Appointment to: No follow-ups on file.  If you have any other questions or concerns, please feel free to call the office or send a message through MyChart. You may also schedule an earlier appointment if necessary.  Additionally, you may be receiving a survey about your experience at our office within a few days to 1 week by e-mail or mail. We value your feedback.  Marsa Officer, DO [facility], [place]

## 2024-07-12 NOTE — Telephone Encounter (Signed)
 Copied from CRM [id number]. Topic: Clinical - Medication Refill >> Jul 12, 2024 10:22 AM Emylou G wrote: Medication: LORazepam  (ATIVAN ) 0.5 MG tablet  Leaving to [facility] Monday  Has the patient contacted their pharmacy? No (Agent: If no, request that the patient contact the pharmacy for the refill. If patient does not wish to contact the pharmacy document the reason why and proceed with request.) (Agent: If yes, when and what did the pharmacy advise?)  This is the patient's preferred pharmacy:  [facility] - [place], [place] - [address] [place] [address] Phone: [phone] Fax: [phone]  Is this the correct pharmacy for this prescription? Yes If no, delete pharmacy and type the correct one.   Has the prescription been filled recently? No  Is the patient out of the medication? Yes  Has the patient been seen for an appointment in the last year OR does the patient have an upcoming appointment? Yes  Can we respond through MyChart? no  Agent: Please be advised that Rx refills may take up to 3 business days. We ask that you follow-up with your pharmacy.

## 2024-07-12 NOTE — Progress Notes (Signed)
 Subjective:    Patient ID: Brenda Caldwell, female    DOB: 02/10/1951, 73 y.o.   MRN: [id number]  Brenda Caldwell is a 73 y.o. female presenting on 07/12/2024 for Anxiety   Virtual / Telehealth Encounter - Video Visit via MyChart The purpose of this virtual visit is to provide medical care while limiting exposure to the novel coronavirus (COVID19) for both patient and office staff.  Consent was obtained for remote visit:  Yes.   Answered questions that patient had about telehealth interaction:  Yes.   I discussed the limitations, risks, security and privacy concerns of performing an evaluation and management service by video/telephone. I also discussed with the patient that there may be a patient responsible charge related to this service. The patient expressed understanding and agreed to proceed.  Patient Location: Home Provider Location: [facility] (Office)  Participants in virtual visit: - Patient: Brenda Caldwell - CMA: Alan Fontana CMA - Provider: Dr Edman   HPI  Discussed the use of AI scribe software for clinical note transcription with the patient, who gave verbal consent to proceed.  History of Present Illness   Brenda Caldwell is a 73 year old female who presents with anxiety related to an upcoming flight.  Anxiety symptoms - Experiences significant anxiety related to an upcoming long flight to [place] with a layover in [place] - Particularly concerned about experiencing severe anxiety during the flight, referencing a prior episode requiring medication on a small plane - Lately anxiety worse with personal losses, including the loss of her daughter and husband Dick, contribute to her anxiety  - Currently on Escitalopram  10mg  daily for maintenance anxiety therapy - Has used lorazepam  0.5 mg as needed for anxiety, especially in situations involving flying - Rarely uses lorazepam  unless experiencing significant anxiety - Prescription for lorazepam  expired in  May and seeks renewal       06/03/2024   10:18 AM 03/02/2024    9:05 AM 08/27/2023    9:58 AM  Depression screen PHQ 2/9  Decreased Interest 0 0 1  Down, Depressed, Hopeless 0  0  PHQ - 2 Score 0 0 1  Altered sleeping  1 0  Tired, decreased energy   0  Change in appetite  0 0  Feeling bad or failure about yourself   0 0  Trouble concentrating  0 0  Moving slowly or fidgety/restless  0 0  Suicidal thoughts  0 0  PHQ-9 Score  1 1  Difficult doing work/chores  Not difficult at all Not difficult at all       03/02/2024    9:05 AM 08/27/2023    9:58 AM 08/11/2023    8:39 AM 06/12/2023    9:26 AM  GAD 7 : Generalized Anxiety Score  Nervous, Anxious, on Edge 0 1 0 1  Control/stop worrying 0 0 0 0  Worry too much - different things 0 1 0 1  Trouble relaxing 0 0 0 0  Restless 0 0 0 0  Easily annoyed or irritable 0 0 0 0  Afraid - awful might happen 0 0 0 1  Total GAD 7 Score 0 2 0 3  Anxiety Difficulty    Somewhat difficult    Social History   Tobacco Use   Smoking status: Never   Smokeless tobacco: Never  Vaping Use   Vaping status: Never Used  Substance Use Topics   Alcohol use: Yes    Alcohol/week: 1.0 standard drink of alcohol  Types: 1 Glasses of wine per week    Comment: 1 glass 3-4 nights per week   Drug use: Never    Review of Systems Per HPI unless specifically indicated above     Objective:    There were no vitals taken for this visit.  Wt Readings from Last 3 Encounters:  06/03/24 172 lb (78 kg)  03/02/24 172 lb 4 oz (78.1 kg)  12/08/23 165 lb 12.8 oz (75.2 kg)     Physical Exam  Note examination was completely remotely via video observation objective data only  Gen - well-appearing, no acute distress or apparent pain, comfortable HEENT - eyes appear clear without discharge or redness Heart/Lungs - cannot examine virtually - observed no evidence of coughing or labored breathing. Abd - cannot examine virtually  Skin - face visible today- no  rash Neuro - awake, alert, oriented Psych - not anxious appearing   Results for orders placed or performed in visit on 02/24/24  TSH   Collection Time: 02/24/24  8:15 AM  Result Value Ref Range   TSH 2.10 0.40 - 4.50 mIU/L  CBC with Differential/Platelet   Collection Time: 02/24/24  8:15 AM  Result Value Ref Range   WBC 7.3 3.8 - 10.8 Thousand/uL   RBC 4.82 3.80 - 5.10 Million/uL   Hemoglobin 12.9 11.7 - 15.5 g/dL   HCT 59.5 64.9 - 54.9 %   MCV 83.8 80.0 - 100.0 fL   MCH 26.8 (L) 27.0 - 33.0 pg   MCHC 31.9 (L) 32.0 - 36.0 g/dL   RDW 86.4 88.9 - 84.9 %   Platelets 266 140 - 400 Thousand/uL   MPV 10.3 7.5 - 12.5 fL   Neutro Abs 4,227 1,500 - 7,800 cells/uL   Absolute Lymphocytes 2,051 850 - 3,900 cells/uL   Absolute Monocytes 781 200 - 950 cells/uL   Eosinophils Absolute 183 15 - 500 cells/uL   Basophils Absolute 58 0 - 200 cells/uL   Neutrophils Relative % 57.9 %   Total Lymphocyte 28.1 %   Monocytes Relative 10.7 %   Eosinophils Relative 2.5 %   Basophils Relative 0.8 %  COMPLETE METABOLIC PANEL WITH GFR   Collection Time: 02/24/24  8:15 AM  Result Value Ref Range   Glucose, Bld 99 65 - 99 mg/dL   BUN 21 7 - 25 mg/dL   Creat 9.29 9.39 - 8.99 mg/dL   BUN/Creatinine Ratio SEE NOTE: 6 - 22 (calc)   Sodium 137 135 - 146 mmol/L   Potassium 4.7 3.5 - 5.3 mmol/L   Chloride 102 98 - 110 mmol/L   CO2 30 20 - 32 mmol/L   Calcium 9.6 8.6 - 10.4 mg/dL   Total Protein 6.6 6.1 - 8.1 g/dL   Albumin 4.5 3.6 - 5.1 g/dL   Globulin 2.1 1.9 - 3.7 g/dL (calc)   AG Ratio 2.1 1.0 - 2.5 (calc)   Total Bilirubin 0.4 0.2 - 1.2 mg/dL   Alkaline phosphatase (APISO) 55 37 - 153 U/L   AST 18 10 - 35 U/L   ALT 17 6 - 29 U/L  Lipid panel   Collection Time: 02/24/24  8:15 AM  Result Value Ref Range   Cholesterol 203 (H) <200 mg/dL   HDL 70 > OR = 50 mg/dL   Triglycerides 879 <849 mg/dL   LDL Cholesterol (Calc) 110 (H) mg/dL (calc)   Total CHOL/HDL Ratio 2.9 <5.0 (calc)   Non-HDL  Cholesterol (Calc) 133 (H) <130 mg/dL (calc)  Hemoglobin J8r  Collection Time: 02/24/24  8:15 AM  Result Value Ref Range   Hgb A1c MFr Bld 6.2 (H) <5.7 %   Mean Plasma Glucose 131 mg/dL   eAG (mmol/L) 7.3 mmol/L      Assessment & Plan:   Problem List Items Addressed This Visit     Anxiety state - Primary      Anxiety disorder Anxiety exacerbated by upcoming flight. Lorazepam  effective previously. Last rx from April 2025. She rarely uses AS NEEDED - Prescribed lorazepam  0.5 mg, 20 pills, every 8 hours as needed for anxiety, especially during flights. - Instructed to take lorazepam  at flight start and during second flight if needed. - Requested home delivery of lorazepam  prescription.   Note baseline anxiety controlled on Lexapro  10mg  daily SSRI.      No orders of the defined types were placed in this encounter.   No orders of the defined types were placed in this encounter.   Follow up plan: Return if symptoms worsen or fail to improve.   Patient verbalizes understanding with the above medical recommendations including the limitation of remote medical advice.  Specific follow-up and call-back criteria were given for patient to follow-up or seek medical care more urgently if needed.  Total duration of direct patient care provided via video conference: 7 minutes   Marsa Officer, DO [facility] 07/12/2024, 4:05 PM

## 2024-08-05 ENCOUNTER — Other Ambulatory Visit

## 2024-08-05 ENCOUNTER — Ambulatory Visit

## 2024-08-16 ENCOUNTER — Ambulatory Visit
Admission: RE | Admit: 2024-08-16 | Discharge: 2024-08-16 | Disposition: A | Discharge status: Home / Self Care | Source: Ambulatory Visit | Attending: Family Medicine | Admitting: Family Medicine

## 2024-08-16 ENCOUNTER — Ambulatory Visit
Admission: RE | Admit: 2024-08-16 | Discharge: 2024-08-16 | Disposition: A | Source: Ambulatory Visit | Attending: Family Medicine | Admitting: Family Medicine

## 2024-08-16 DIAGNOSIS — Z1231 Encounter for screening mammogram for malignant neoplasm of breast: Secondary | ICD-10-CM

## 2024-08-16 DIAGNOSIS — Z1211 Encounter for screening for malignant neoplasm of colon: Secondary | ICD-10-CM

## 2024-08-16 DIAGNOSIS — Z13820 Encounter for screening for osteoporosis: Secondary | ICD-10-CM | POA: Diagnosis not present

## 2024-08-16 DIAGNOSIS — M8588 Other specified disorders of bone density and structure, other site: Secondary | ICD-10-CM | POA: Diagnosis not present

## 2024-08-16 DIAGNOSIS — Z780 Asymptomatic menopausal state: Secondary | ICD-10-CM | POA: Insufficient documentation

## 2024-08-17 ENCOUNTER — Ambulatory Visit: Payer: Self-pay | Admitting: Family Medicine

## 2024-08-17 DIAGNOSIS — M85852 Other specified disorders of bone density and structure, left thigh: Secondary | ICD-10-CM | POA: Insufficient documentation

## 2024-09-02 ENCOUNTER — Other Ambulatory Visit: Payer: Self-pay | Admitting: Family Medicine

## 2024-09-02 ENCOUNTER — Encounter: Payer: Self-pay | Admitting: Family Medicine

## 2024-09-02 ENCOUNTER — Ambulatory Visit
Admission: RE | Admit: 2024-09-02 | Discharge: 2024-09-02 | Disposition: A | Discharge status: Home / Self Care | Source: Ambulatory Visit | Attending: Family Medicine | Admitting: Family Medicine

## 2024-09-02 ENCOUNTER — Ambulatory Visit: Admitting: Family Medicine

## 2024-09-02 VITALS — BP 122/80 | HR 72 | Ht 67.0 in | Wt 169.4 lb

## 2024-09-02 DIAGNOSIS — E7800 Pure hypercholesterolemia, unspecified: Secondary | ICD-10-CM | POA: Diagnosis not present

## 2024-09-02 DIAGNOSIS — G8929 Other chronic pain: Secondary | ICD-10-CM | POA: Diagnosis present

## 2024-09-02 DIAGNOSIS — R7309 Other abnormal glucose: Secondary | ICD-10-CM

## 2024-09-02 DIAGNOSIS — M25552 Pain in left hip: Secondary | ICD-10-CM

## 2024-09-02 DIAGNOSIS — Z0000 Encounter for general adult medical examination without abnormal findings: Secondary | ICD-10-CM

## 2024-09-02 DIAGNOSIS — R7303 Prediabetes: Secondary | ICD-10-CM

## 2024-09-02 DIAGNOSIS — I10 Essential (primary) hypertension: Secondary | ICD-10-CM

## 2024-09-02 DIAGNOSIS — Z23 Encounter for immunization: Secondary | ICD-10-CM

## 2024-09-02 DIAGNOSIS — E559 Vitamin D deficiency, unspecified: Secondary | ICD-10-CM

## 2024-09-02 LAB — POCT GLYCOSYLATED HEMOGLOBIN (HGB A1C): Hemoglobin A1C: 6 % — AB (ref 4.0–5.6)

## 2024-09-02 NOTE — Patient Instructions (Addendum)
 Thank you for coming to the office today.  Upcoming GI visit for Colonoscopy planning.  X-ray Left hip  Probably arthritis, possibly muscle tendonitis overuse / strain.  Recent Labs    02/24/24 0815 09/02/24 0913  HGBA1C 6.2* 6.0*   Flu Shot today  Future Tetanus here  Future Shingles vaccine at the pharmacy, 2 doses 2-6 months   DUE for FASTING BLOOD WORK (no food or drink after midnight before the lab appointment, only water or coffee without cream/sugar on the morning of)  SCHEDULE Lab Only visit in the morning at the clinic for lab draw in 6 MONTHS   - Make sure Lab Only appointment is at about 1 week before your next appointment, so that results will be available  For Lab Results, once available within 2-3 days of blood draw, you can can log in to MyChart online to view your results and a brief explanation. Also, we can discuss results at next follow-up visit.   Please schedule a Follow-up Appointment to: Return in about 6 months (around 03/02/2025) for 6 month fasting lab > 1 week later Annual Physical.  If you have any other questions or concerns, please feel free to call the office or send a message through MyChart. You may also schedule an earlier appointment if necessary.  Additionally, you may be receiving a survey about your experience at our office within a few days to 1 week by e-mail or mail. We value your feedback.  Marsa Officer, DO [facility], [place]

## 2024-09-02 NOTE — Progress Notes (Signed)
 Subjective:    Patient ID: Brenda Caldwell, female    DOB: February 16, 1951, 73 y.o.   MRN: [id number]  Brenda Caldwell is a 73 y.o. female presenting on 09/02/2024 for Medical Management of Chronic Issues   HPI  Discussed the use of AI scribe software for clinical note transcription with the patient, who gave verbal consent to proceed.  History of Present Illness   Brenda Caldwell is a 73 year old female who presents for a six-month follow-up visit.   Left Hip Pain Groin pain - Groin pain predominantly on the left side, occasionally reaching a pain level of 10 - Pain exacerbated by walking - Requires lifting her leg to enter a car due to pain - History of similar pain on the right side during travel in [place]  Osteopenia on DEXA  Pre-Diabetes A1c down to 6.0, from 6.2 Improving exercise lifestyle She has new dog and does a lot of walking each day Weight down 3 lbs  Anxiety  - Currently on Escitalopram  10mg  daily for maintenance anxiety therapy - Has used lorazepam  0.5 mg as needed for anxiety, especially in situations involving flying - Rarely uses lorazepam  unless experiencing significant anxiety - Prescription for lorazepam  expired in May and seeks renewal     Thoracic Inlet Swelling Soft Tissue Resolving Followed [facility] Pulmonology Dr Parris Issue of neck fullness swelling - No further concerns at this time. This issue was unresolved but pulmonology ruled out significant problems. No new concern   Anxiety On Escitalopram  10mg  daily Rarely taking Lorazepam  0.5mg  AS NEEDED. Needs re order   CHRONIC HTN: Controlled Current Meds - Valsartan  320mg  daily, Amlodipine  5mg  daily Reports good compliance, took meds today. Tolerating well, w/o complaints. Denies CP, dyspnea, HA, edema, dizziness / lightheadedness     Health Maintenance: Updated Colonoscopy 07/30/19, negative no polyps, repeat 5 yr Next due 07/2024 She is scheduled w/ [facility] 11/24 for consult  for colonoscopy  Flu Shot today  Due for Shingles vaccine shingrix , at pharmacy      09/02/2024    9:08 AM 06/03/2024   10:18 AM 03/02/2024    9:05 AM  Depression screen PHQ 2/9  Decreased Interest 0 0 0  Down, Depressed, Hopeless 0 0   PHQ - 2 Score 0 0 0  Altered sleeping   1  Change in appetite   0  Feeling bad or failure about yourself    0  Trouble concentrating   0  Moving slowly or fidgety/restless   0  Suicidal thoughts   0  PHQ-9 Score   1   Difficult doing work/chores   Not difficult at all     Data saved with a previous flowsheet row definition       09/02/2024    9:08 AM 03/02/2024    9:05 AM 08/27/2023    9:58 AM 08/11/2023    8:39 AM  GAD 7 : Generalized Anxiety Score  Nervous, Anxious, on Edge 0 0 1 0  Control/stop worrying 0 0 0 0  Worry too much - different things 0 0 1 0  Trouble relaxing 0 0 0 0  Restless 0 0 0 0  Easily annoyed or irritable 0 0 0 0  Afraid - awful might happen 0 0 0 0  Total GAD 7 Score 0 0 2 0    Social History   Tobacco Use   Smoking status: Never   Smokeless tobacco: Never  Vaping Use   Vaping status: Never  Used  Substance Use Topics   Alcohol use: Yes    Alcohol/week: 1.0 standard drink of alcohol    Types: 1 Glasses of wine per week    Comment: 1 glass 3-4 nights per week   Drug use: Never    Review of Systems Per HPI unless specifically indicated above     Objective:    BP 122/80 (BP Location: Right Arm, Patient Position: Sitting, Cuff Size: Normal)   Pulse 72   Ht 5' 7 (1.702 m)   Wt 169 lb 6 oz (76.8 kg)   SpO2 96%   BMI 26.53 kg/m   Wt Readings from Last 3 Encounters:  09/02/24 169 lb 6 oz (76.8 kg)  06/03/24 172 lb (78 kg)  03/02/24 172 lb 4 oz (78.1 kg)    Physical Exam Vitals and nursing note reviewed.  Constitutional:      General: She is not in acute distress.    Appearance: Normal appearance. She is well-developed. She is not diaphoretic.     Comments: Well-appearing, comfortable,  cooperative  HENT:     Head: Normocephalic and atraumatic.  Eyes:     General:        Right eye: No discharge.        Left eye: No discharge.     Conjunctiva/sclera: Conjunctivae normal.  Cardiovascular:     Rate and Rhythm: Normal rate.  Pulmonary:     Effort: Pulmonary effort is normal.  Musculoskeletal:     Comments: Good range of motion bilateral hips. Including hip flex extend and internal ext rotation  Skin:    General: Skin is warm and dry.     Findings: No erythema or rash.  Neurological:     Mental Status: She is alert and oriented to person, place, and time.  Psychiatric:        Mood and Affect: Mood normal.        Behavior: Behavior normal.        Thought Content: Thought content normal.     Comments: Well groomed, good eye contact, normal speech and thoughts     Results for orders placed or performed in visit on 09/02/24  POCT HgB A1C   Collection Time: 09/02/24  9:13 AM  Result Value Ref Range   Hemoglobin A1C 6.0 (A) 4.0 - 5.6 %   HbA1c POC (<> result, manual entry)     HbA1c, POC (prediabetic range)     HbA1c, POC (controlled diabetic range)        Assessment & Plan:   Problem List Items Addressed This Visit     Benign essential hypertension   Pure hypercholesterolemia   Other Visit Diagnoses       Pre-diabetes    -  Primary     Flu vaccine need       Relevant Orders   Flu vaccine HIGH DOSE PF(Fluzone Trivalent) (Completed)     Abnormal glucose       Relevant Orders   POCT HgB A1C (Completed)     Chronic left hip pain       Relevant Orders   DG HIP UNILAT W OR W/O PELVIS 2-3 VIEWS LEFT        Discussed vaccinations including flu, shingles, tetanus, and COVID. Shingles vaccine recommended due to potential side effects and availability at pharmacies. Tetanus vaccine discussed but deferred. COVID vaccine not recommended due to low risk status. - Administered flu shot today. - Scheduled GI for colonoscopy . - Provided reminder for shingles  vaccine at pharmacy. - Deferred tetanus vaccine until next visit.  Prediabetes A1c improved from 6.2 to 6.0, indicating mild to moderate prediabetes. Weight loss of 3 pounds noted. Encouraged to continue current lifestyle modifications including walking and water aerobics. - Continue monitoring A1c levels. - Encouraged continued lifestyle modifications.  Left hip pain, likely osteoarthritis Left hip pain likely due to osteoarthritis or muscle tendinitis. Pain exacerbated by walking and certain movements. Previous imaging showed mild to moderate degenerative changes in the spine, suggesting possible arthritis in the hips. Differential includes hip joint arthritis or muscle strain. - Ordered left hip x-ray. - Continue current activities and lifestyle modifications.  Abnormal thoracic inlet/outlet fullness (benign, stable) Benign and stable fullness in the thoracic inlet/outlet area. Previous evaluations by pulmonologist ruled out malignancy. Likely due to fluid or subcutaneous tissue filling the potential space. - Continue monitoring for changes.        Orders Placed This Encounter  Procedures   DG HIP UNILAT W OR W/O PELVIS 2-3 VIEWS LEFT    Standing Status:   Future    Number of Occurrences:   1    Expiration Date:   11/02/2025    Reason for Exam (SYMPTOM  OR DIAGNOSIS REQUIRED):   chronic left hip joint and groin pain. arthritis evaluation    Preferred imaging location?:   [facility]    Radiology Contrast Protocol - do NOT remove file path:   [email or web address]   Flu vaccine HIGH DOSE PF(Fluzone Trivalent)   POCT HgB A1C    No orders of the defined types were placed in this encounter.   Follow up plan: Return in about 6 months (around 03/02/2025) for 6 month fasting lab > 1 week later Annual Physical.  Future labs ordered for 02/23/25  Marsa Officer, DO [facility] 09/02/2024, 9:17 AM

## 2024-09-08 ENCOUNTER — Ambulatory Visit: Payer: Self-pay | Admitting: Family Medicine

## 2024-09-15 ENCOUNTER — Encounter: Payer: Self-pay | Admitting: Internal Medicine

## 2024-09-22 ENCOUNTER — Telehealth: Payer: Self-pay

## 2024-09-22 DIAGNOSIS — G8929 Other chronic pain: Secondary | ICD-10-CM

## 2024-09-22 DIAGNOSIS — M1612 Unilateral primary osteoarthritis, left hip: Secondary | ICD-10-CM

## 2024-09-22 NOTE — Addendum Note (Signed)
 Addended by: EDMAN MARSA PARAS on: 09/22/2024 03:14 PM   Modules accepted: Orders

## 2024-09-22 NOTE — Telephone Encounter (Signed)
 Copied from CRM #[id number]. Topic: Referral - Request for Referral >> Sep 22, 2024  2:15 PM Lauren C wrote: Did the patient discuss referral with their provider in the last year? Yes, referral offered last appt but was declined at the time. (If No - schedule appointment) (If Yes - send message)  Appointment offered? No  Type of order/referral and detailed reason for visit: Physical therapy  Preference of office, provider, location: Any preferred location nearby of Dr. MARLA.  If referral order, have you been seen by this specialty before? No (If Yes, this issue or another issue? When? Where?  Can we respond through MyChart? Please also call [phone] in addition to the automatic referral message via mychart.

## 2024-09-22 NOTE — Telephone Encounter (Signed)
 Referral has been sent to [facility] - Main rehab site at [facility].  They will call patient with scheduling.  Marsa Officer, DO [facility] 09/22/2024, 3:14 PM

## 2024-09-29 ENCOUNTER — Ambulatory Visit: Admitting: Anesthesiology

## 2024-09-29 ENCOUNTER — Encounter: Admission: RE | Disposition: A | Payer: Self-pay | Source: Home / Self Care | Attending: Internal Medicine

## 2024-09-29 ENCOUNTER — Encounter: Payer: Self-pay | Admitting: Internal Medicine

## 2024-09-29 ENCOUNTER — Ambulatory Visit
Admission: RE | Admit: 2024-09-29 | Discharge: 2024-09-29 | Disposition: A | Discharge status: Home / Self Care | Attending: Internal Medicine | Admitting: Internal Medicine

## 2024-09-29 HISTORY — DX: Allergy, unspecified, initial encounter: T78.40XA

## 2024-09-29 HISTORY — PX: COLONOSCOPY: SHX5424

## 2024-09-29 HISTORY — PX: POLYPECTOMY: SHX149

## 2024-09-29 SURGERY — COLONOSCOPY
Anesthesia: General

## 2024-09-29 MED ORDER — DEXMEDETOMIDINE HCL IN NACL 80 MCG/20ML IV SOLN
INTRAVENOUS | Status: DC | PRN
  Administered 2024-09-29: 8 ug via INTRAVENOUS
  Administered 2024-09-29: 12 ug via INTRAVENOUS

## 2024-09-29 MED ORDER — PROPOFOL 500 MG/50ML IV EMUL
INTRAVENOUS | Status: DC | PRN
  Administered 2024-09-29: 75 ug/kg/min via INTRAVENOUS

## 2024-09-29 MED ORDER — SODIUM CHLORIDE 0.9 % IV SOLN
INTRAVENOUS | Status: DC
  Administered 2024-09-29: 20 mL/h via INTRAVENOUS

## 2024-09-29 MED ORDER — PROPOFOL 10 MG/ML IV BOLUS
INTRAVENOUS | Status: DC | PRN
  Administered 2024-09-29 (×2): 50 mg via INTRAVENOUS

## 2024-09-29 MED ORDER — LIDOCAINE HCL (CARDIAC) PF 100 MG/5ML IV SOSY
PREFILLED_SYRINGE | INTRAVENOUS | Status: DC | PRN
  Administered 2024-09-29: 60 mg via INTRAVENOUS

## 2024-09-29 MED ORDER — LIDOCAINE HCL (PF) 2 % IJ SOLN
INTRAMUSCULAR | Status: AC
  Filled 2024-09-29: qty 5

## 2024-09-29 NOTE — Interval H&P Note (Signed)
 History and Physical Interval Note:  09/29/2024 8:23 AM  Brenda Caldwell  has presented today for surgery, with the diagnosis of Colon cancer screening (Z12.11) Family hx colonic polyps (Z83.719).  The various methods of treatment have been discussed with the patient and family. After consideration of risks, benefits and other options for treatment, the patient has consented to  Procedure(s) with comments: COLONOSCOPY (N/A) - EARLY as a surgical intervention.  The patient's history has been reviewed, patient examined, no change in status, stable for surgery.  I have reviewed the patient's chart and labs.  Questions were answered to the patient's satisfaction.     [place], Shadana Pry

## 2024-09-29 NOTE — H&P (Signed)
 Outpatient short stay form Pre-procedure 09/29/2024 8:22 AM Brenda Virginia K. Aundria, M.D.  Primary Physician: Marsa Officer, M.D.  Reason for visit:  Family history of colon polylps  History of present illness:   Has the patient had any prior colon evaluation: [x]  Yes -Colonoscopy - _10/9/20 DR. Jamas Jaquay NORMAL_ -Colonoscopy - _4/16/08 DR. SIEGEL NORMAL_     Current Facility-Administered Medications:    0.9 %  sodium chloride infusion, , Intravenous, Continuous, [place], Ladell POUR, MD, Last Rate: 20 mL/hr at 09/29/24 0816, 20 mL/hr at 09/29/24 0816  Medications Prior to Admission  Medication Sig Dispense Refill Last Dose/Taking   amLODipine  (NORVASC ) 5 MG tablet Take 1 tablet (5 mg total) by mouth daily. 90 tablet 3 09/28/2024   escitalopram  (LEXAPRO ) 10 MG tablet TAKE 1 TABLET BY MOUTH EVERY DAY. 90 tablet 1 09/28/2024   fluticasone (FLONASE) 50 MCG/ACT nasal spray Place 2 sprays into both nostrils daily.   09/28/2024   LORazepam  (ATIVAN ) 0.5 MG tablet Take 1 tablet (0.5 mg total) by mouth every 8 (eight) hours as needed for anxiety. 20 tablet 0 09/28/2024   valsartan  (DIOVAN ) 320 MG tablet Take 1 tablet (320 mg total) by mouth daily. 90 tablet 1 09/28/2024     No Known Allergies   Past Medical History:  Diagnosis Date   Allergic disorder    Anxiety    Hypertension     Review of systems:  Otherwise negative.    Physical Exam  Gen: Alert, oriented. Appears stated age.  HEENT: /AT. PERRLA. Lungs: CTA, no wheezes. CV: RR nl S1, S2. Abd: soft, benign, no masses. BS+ Ext: No edema. Pulses 2+    Planned procedures: Proceed with colonoscopy. The patient understands the nature of the planned procedure, indications, risks, alternatives and potential complications including but not limited to bleeding, infection, perforation, damage to internal organs and possible oversedation/side effects from anesthesia. The patient agrees and gives consent to proceed.  Please refer to  procedure notes for findings, recommendations and patient disposition/instructions.     Amy Gothard K. Aundria, M.D. Gastroenterology 09/29/2024  8:22 AM

## 2024-09-29 NOTE — Anesthesia Preprocedure Evaluation (Signed)
 Anesthesia Evaluation  Patient identified by MRN, date of birth, ID band Patient awake    Reviewed: Allergy & Precautions, H&P , NPO status , Patient's Chart, lab work & pertinent test results  Airway Mallampati: II  TM Distance: >3 FB Neck ROM: full    Dental no notable dental hx.    Pulmonary neg pulmonary ROS   Pulmonary exam normal        Cardiovascular hypertension, Normal cardiovascular exam     Neuro/Psych  PSYCHIATRIC DISORDERS Anxiety     negative neurological ROS     GI/Hepatic negative GI ROS, Neg liver ROS,,,  Endo/Other  negative endocrine ROS    Renal/GU negative Renal ROS  negative genitourinary   Musculoskeletal   Abdominal   Peds  Hematology negative hematology ROS (+)   Anesthesia Other Findings Past Medical History: No date: Allergic disorder No date: Anxiety No date: Hypertension  Past Surgical History: 2000: bladder tack 2000: TOTAL ABDOMINAL HYSTERECTOMY  BMI    Body Mass Index: 26.36 kg/m      Reproductive/Obstetrics negative OB ROS                              Anesthesia Physical Anesthesia Plan  ASA: 2  Anesthesia Plan: General   Post-op Pain Management: Minimal or no pain anticipated   Induction: Intravenous  PONV Risk Score and Plan: Propofol infusion and TIVA  Airway Management Planned: Natural Airway  Additional Equipment:   Intra-op Plan:   Post-operative Plan:   Informed Consent: I have reviewed the patients History and Physical, chart, labs and discussed the procedure including the risks, benefits and alternatives for the proposed anesthesia with the patient or authorized representative who has indicated his/her understanding and acceptance.     Dental Advisory Given  Plan Discussed with: CRNA and Surgeon  Anesthesia Plan Comments:          Anesthesia Quick Evaluation

## 2024-09-29 NOTE — Anesthesia Postprocedure Evaluation (Signed)
 Anesthesia Post Note  Patient: Neria E Mavity  Procedure(s) Performed: COLONOSCOPY POLYPECTOMY, INTESTINE  Patient location during evaluation: Endoscopy Anesthesia Type: General Level of consciousness: awake and alert Pain management: pain level controlled Vital Signs Assessment: post-procedure vital signs reviewed and stable Respiratory status: spontaneous breathing, nonlabored ventilation and respiratory function stable Cardiovascular status: blood pressure returned to baseline and stable Postop Assessment: no apparent nausea or vomiting Anesthetic complications: no   No notable events documented.   Last Vitals:  Vitals:   09/29/24 0938 09/29/24 0948  BP: (!) 87/56 94/69  Pulse: (!) 54 63  Resp: 12 14  Temp:    SpO2: 96% 96%    Last Pain:  Vitals:   09/29/24 0948  TempSrc:   PainSc: 0-No pain                 Camellia Merilee Louder

## 2024-09-29 NOTE — Transfer of Care (Signed)
 Immediate Anesthesia Transfer of Care Note  Patient: Brenda Caldwell  Procedure(s) Performed: COLONOSCOPY POLYPECTOMY, INTESTINE  Patient Location: PACU  Anesthesia Type:General  Level of Consciousness: sedated  Airway & Oxygen Therapy: Patient Spontanous Breathing  Post-op Assessment: Report given to RN and Post -op Vital signs reviewed and stable  Post vital signs: Reviewed and stable  Last Vitals:  Vitals Value Taken Time  BP    Temp    Pulse 54 09/29/24 09:39  Resp 12 09/29/24 09:39  SpO2 96 % 09/29/24 09:39  Vitals shown include unfiled device data.  Last Pain:  Vitals:   09/29/24 0808  TempSrc: Temporal  PainSc: 0-No pain         Complications: No notable events documented.

## 2024-09-29 NOTE — Interval H&P Note (Signed)
 History and Physical Interval Note:  09/29/2024 8:24 AM  Brenda Caldwell  has presented today for surgery, with the diagnosis of Colon cancer screening (Z12.11) Family hx colonic polyps (Z83.719).  The various methods of treatment have been discussed with the patient and family. After consideration of risks, benefits and other options for treatment, the patient has consented to  Procedure(s) with comments: COLONOSCOPY (N/A) - EARLY as a surgical intervention.  The patient's history has been reviewed, patient examined, no change in status, stable for surgery.  I have reviewed the patient's chart and labs.  Questions were answered to the patient's satisfaction.     [place], Nehemias Sauceda

## 2024-09-29 NOTE — Op Note (Signed)
 [facility] Gastroenterology Patient Name: Brenda Caldwell Procedure Date: 09/29/2024 9:05 AM MRN: [id number] Account #: [id number] Date of Birth: 1951/01/16 Admit Type: Outpatient Age: 73 Room: [facility] ENDO ROOM 2 Gender: Female Note Status: Finalized Instrument Name: Colon Scope [id number] Procedure:             Colonoscopy Indications:           Screening for colorectal malignant neoplasm Providers:             Alixandra Alfieri K. Aundria MD, MD Referring MD:          Brenda Caldwell (Referring MD) Medicines:             Propofol per Anesthesia Complications:         No immediate complications. Estimated blood loss:                         Minimal. Procedure:             Pre-Anesthesia Assessment:                        - The risks and benefits of the procedure and the                         sedation options and risks were discussed with the                         patient. All questions were answered and informed                         consent was obtained.                        - Patient identification and proposed procedure were                         verified prior to the procedure by the nurse. The                         procedure was verified in the procedure room.                        - ASA Grade Assessment: III - A patient with severe                         systemic disease.                        - After reviewing the risks and benefits, the patient                         was deemed in satisfactory condition to undergo the                         procedure.                        After obtaining informed consent, the colonoscope was                         passed under direct  vision. Throughout the procedure,                         the patient's blood pressure, pulse, and oxygen                         saturations were monitored continuously. The                         Colonoscope was introduced through the anus and                         advanced  to the the cecum, identified by appendiceal                         orifice and ileocecal valve. The colonoscopy was                         performed without difficulty. The patient tolerated                         the procedure well. The quality of the bowel                         preparation was good. The ileocecal valve, appendiceal                         orifice, and rectum were photographed. Findings:      The perianal and digital rectal examinations were normal. Pertinent       negatives include normal sphincter tone and no palpable rectal lesions.      Non-bleeding internal hemorrhoids were found during retroflexion. The       hemorrhoids were Grade I (internal hemorrhoids that do not prolapse).      A 3 mm polyp was found in the descending colon. The polyp was sessile.       The polyp was removed with a cold biopsy forceps. Resection and       retrieval were complete. Estimated blood loss was minimal.      The exam was otherwise without abnormality. Impression:            - Non-bleeding internal hemorrhoids.                        - One 3 mm polyp in the descending colon, removed with                         a cold biopsy forceps. Resected and retrieved.                        - The examination was otherwise normal. Recommendation:        - Patient has a contact number available for                         emergencies. The signs and symptoms of potential                         delayed complications were discussed with the patient.  Return to normal activities tomorrow. Written                         discharge instructions were provided to the patient.                        - Resume previous diet.                        - Continue present medications.                        - Repeat colonoscopy is recommended for surveillance.                         The colonoscopy date will be determined after                         pathology results from today's  exam become available                         for review.                        - Return to GI office PRN.                        - The findings and recommendations were discussed with                         the patient. Procedure Code(s):     --- Professional ---                        [phone], Colonoscopy, flexible; with biopsy, single or                         multiple Diagnosis Code(s):     --- Professional ---                        K64.0, First degree hemorrhoids                        D12.4, Benign neoplasm of descending colon                        Z12.11, Encounter for screening for malignant neoplasm                         of colon CPT copyright 2022 American Medical Association. All rights reserved. The codes documented in this report are preliminary and upon coder review may  be revised to meet current compliance requirements. Ladell MARLA Boss MD, MD 09/29/2024 9:42:46 AM This report has been signed electronically. Number of Addenda: 0 Note Initiated On: 09/29/2024 9:05 AM Scope Withdrawal Time: 0 hours 7 minutes 20 seconds  Total Procedure Duration: 0 hours 12 minutes 53 seconds  Estimated Blood Loss:  Estimated blood loss was minimal.      [facility]

## 2024-09-30 LAB — SURGICAL PATHOLOGY

## 2024-10-05 ENCOUNTER — Other Ambulatory Visit: Payer: Self-pay | Admitting: Family Medicine

## 2024-10-05 DIAGNOSIS — F411 Generalized anxiety disorder: Secondary | ICD-10-CM

## 2024-10-05 NOTE — Telephone Encounter (Unsigned)
 Copied from CRM #[id number]. Topic: Clinical - Medication Refill >> Oct 05, 2024  9:33 AM Shanda MATSU wrote: Medication: escitalopram  (LEXAPRO ) 10 MG tablet, fluticasone (FLONASE) 50 MCG/ACT nasal spray   Has the patient contacted their pharmacy? Yes, referred to provider (Agent: If no, request that the patient contact the pharmacy for the refill. If patient does not wish to contact the pharmacy document the reason why and proceed with request.) (Agent: If yes, when and what did the pharmacy advise?)  This is the patient's preferred pharmacy:  [facility] - [place], [place] - [address] [place] [address] Phone: [phone] Fax: [phone]  Is this the correct pharmacy for this prescription? Yes If no, delete pharmacy and type the correct one.   Has the prescription been filled recently? No  Is the patient out of the medication? Yes  Has the patient been seen for an appointment in the last year OR does the patient have an upcoming appointment? Yes  Can we respond through MyChart? Yes  Agent: Please be advised that Rx refills may take up to 3 business days. We ask that you follow-up with your pharmacy.

## 2024-10-08 MED ORDER — FLUTICASONE PROPIONATE 50 MCG/ACT NA SUSP: 2.0000 | Freq: Every day | NASAL | 0 refills | Status: AC

## 2024-10-08 MED ORDER — ESCITALOPRAM OXALATE 10 MG PO TABS: 10.0000 mg | ORAL_TABLET | Freq: Every day | ORAL | 1 refills | Status: AC

## 2024-10-08 NOTE — Telephone Encounter (Signed)
 Requested Prescriptions  Pending Prescriptions Disp Refills   escitalopram  (LEXAPRO ) 10 MG tablet 90 tablet 1    Sig: Take 1 tablet (10 mg total) by mouth daily.     Psychiatry:  Antidepressants - SSRI Passed - 10/08/2024  8:48 AM      Passed - Valid encounter within last 6 months    Recent Outpatient Visits           1 month ago Pre-diabetes   [place] [facility] [place], Marsa PARAS, DO   2 months ago Anxiety state   [place] [facility] [place] Marsa PARAS, DO   7 months ago Annual physical exam   [place] [facility] [place], Marsa PARAS, DO               fluticasone (FLONASE) 50 MCG/ACT nasal spray      Sig: Place 2 sprays into both nostrils daily.     Ear, Nose, and Throat: Nasal Preparations - Corticosteroids Passed - 10/08/2024  8:48 AM      Passed - Valid encounter within last 12 months    Recent Outpatient Visits           1 month ago Pre-diabetes   [place] [facility] [place], Marsa PARAS, DO   2 months ago Anxiety state   [place] [facility] [place] Marsa PARAS, DO   7 months ago Annual physical exam   [facility] [place], Marsa PARAS, [place]

## 2024-10-08 NOTE — Telephone Encounter (Signed)
 Requested medication (s) are due for refill today: routing for review  Requested medication (s) are on the active medication list: no  Last refill:  01/28/23  Future visit scheduled: yes  Notes to clinic:  Historical medication     Requested Prescriptions  Pending Prescriptions Disp Refills   fluticasone  (FLONASE ) 50 MCG/ACT nasal spray      Sig: Place 2 sprays into both nostrils daily.     Ear, Nose, and Throat: Nasal Preparations - Corticosteroids Passed - 10/08/2024  8:49 AM      Passed - Valid encounter within last 12 months    Recent Outpatient Visits           1 month ago Pre-diabetes   [place] [facility] [place], Marsa PARAS, DO   2 months ago Anxiety state   [place] [facility] Marsa PARAS, DO   7 months ago Annual physical exam   [place] [facility] Marsa PARAS, DO              Signed Prescriptions Disp Refills   escitalopram  (LEXAPRO ) 10 MG tablet 90 tablet 1    Sig: Take 1 tablet (10 mg total) by mouth daily.     Psychiatry:  Antidepressants - SSRI Passed - 10/08/2024  8:49 AM      Passed - Valid encounter within last 6 months    Recent Outpatient Visits           1 month ago Pre-diabetes   [place] [facility], Marsa PARAS, DO   2 months ago Anxiety state   [place] [facility] Marsa PARAS, DO   7 months ago Annual physical exam   [facility], Marsa PARAS, [place]

## 2024-10-11 ENCOUNTER — Ambulatory Visit: Payer: Self-pay

## 2024-10-11 ENCOUNTER — Encounter: Payer: Self-pay | Admitting: Student

## 2024-10-11 ENCOUNTER — Ambulatory Visit (INDEPENDENT_AMBULATORY_CARE_PROVIDER_SITE_OTHER): Admitting: Student

## 2024-10-11 VITALS — BP 116/74 | HR 83 | Temp 97.8°F | Ht 66.5 in

## 2024-10-11 DIAGNOSIS — H61002 Unspecified perichondritis of left external ear: Secondary | ICD-10-CM | POA: Diagnosis not present

## 2024-10-11 DIAGNOSIS — H61009 Unspecified perichondritis of external ear, unspecified ear: Secondary | ICD-10-CM | POA: Insufficient documentation

## 2024-10-11 MED ORDER — CIPROFLOXACIN HCL 500 MG PO TABS: 500.0000 mg | ORAL_TABLET | Freq: Two times a day (BID) | ORAL | 0 refills | Status: AC

## 2024-10-11 NOTE — Telephone Encounter (Signed)
" °  FYI Only or Action Required?: FYI only for provider: appointment scheduled on 10/11/24.  Patient was last seen in primary care on 09/02/2024 by [place] Marsa PARAS, DO.  Called Nurse Triage reporting Ear Pain.  Symptoms began several days ago.  Interventions attempted: Rest, hydration, or home remedies.  Symptoms are: stable.  Triage Disposition: See HCP Within 4 Hours (Or PCP Triage)  Patient/caregiver understands and will follow disposition?:   Copied from CRM #[id number]. Topic: Clinical - Red Word Triage >> Oct 11, 2024 11:24 AM Dedra NOVAK wrote: Red Word that prompted transfer to Nurse Triage: Pt got L ear (upper lobe) pierced a month ago. She believes it's infected. The ear is swollen and said it's closing the hearing off in the ear. Warm transfer to NT. Reason for Disposition  [1] Ear pain AND [2] entire lower ear is red or swollen  Answer Assessment - Initial Assessment Questions 1. SYMPTOMS: What symptoms are you concerned about?     Swelling, redness  2. ONSET:  When did the symptoms start?     Several days ago 3. OTHER SYMPTOMS: Do you have any other symptoms? (e.g., drainage, fever, local pain, itching)     Pain when pressed on.  4. PIERCING LOCATION: Where is the piercing? (e.g., ear lobe, upper ear)      [place]  5. PIERCING DATE: When did you get your piercing?     A month ago 6. JEWELRY MATERIAL: What is your piercing jewelry made out of? (e.g., nickel, 18 K gold, surgical steel)     Removed jewelry 7. ALLERGY: Have you ever been diagnosed with a nickel allergy?      8. PREGNANCY: Is there any chance you are pregnant? When was your last menstrual period?  Protocols used: Ear Piercing Symptoms and Questions-A-AH  "

## 2024-10-11 NOTE — Progress Notes (Signed)
 "  Established Patient Office Visit  Subjective   Patient ID: Brenda Caldwell, female    DOB: Aug 01, 1951  Age: 73 y.o. MRN: [id number]  Chief Complaint  Patient presents with   Ear Problem    Pierced ear 6 months ago, left swollen ear since 1 month ago, switched ear rings but now her ear is swollen, tender     Brenda Caldwell is a 73 y.o. person with medical hx listed below who presents today for left ear pain and that started a month ago. She had a daith piercing about 6 months ago. Original piercing replaced with a hoop a few months ago. Wearing the same hoops on ear lobes without issue. 1 month ago noticed swelling around the left tragus with associated erythema and mild pain. Occasionally has a small amount of crusting around the tragus but denies significant drainage. Continued to wear piercing until around 2 weeks ago. Denies hearing changes, fevers, chills, bleeding, trauma to the ear, submerging the ear. Reports unlikely to for there to be a foreign body in the ear. 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   ciprofloxacin  (CIPRO ) 500 MG tablet Take 1 tablet (500 mg total) by mouth 2 (two) times daily for 10 days.   escitalopram  (LEXAPRO ) 10 MG tablet Take 1 tablet (10 mg total) by mouth daily.   fluticasone  (FLONASE ) 50 MCG/ACT nasal spray Place 2 sprays into both nostrils daily.   LORazepam  (ATIVAN ) 0.5 MG tablet Take 1 tablet (0.5 mg total) by mouth every 8 (eight) hours as needed for anxiety.   valsartan  (DIOVAN ) 320 MG tablet Take 1 tablet (320 mg total) by mouth daily.    No facility-administered encounter medications on file as of 10/11/2024.    BP 116/74   Pulse 83   Temp 97.8 F (36.6 C) (Oral)   Ht 5' 6.5 (1.689 m)   SpO2 98%   BMI 26.36 kg/m  BP Readings from Last 3 Encounters:  10/11/24 116/74  09/29/24 102/67  09/02/24 122/80    Physical Exam Constitutional:      Appearance: Normal appearance.  HENT:     Head: Normocephalic and atraumatic.     Right Ear: Tympanic membrane normal.     Left Ear: Tympanic membrane normal.     Ears:     Comments: Swelling and erythema of the tragus without fluctuance, minimal pain to palpation.     Mouth/Throat:     Mouth: Mucous membranes are moist.     Pharynx: Oropharynx is clear.  Cardiovascular:     Rate and Rhythm: Normal rate and regular rhythm.  Pulmonary:     Effort: Pulmonary effort is normal.     Breath sounds: No rhonchi or rales.  Abdominal:     General: Abdomen is flat. Bowel sounds are normal. There is no distension.     Palpations: Abdomen is soft.     Tenderness: There is no abdominal tenderness.  Musculoskeletal:        General: Normal range of motion.  Right lower leg: No edema.     Left lower leg: No edema.  Skin:    General: Skin is warm and dry.     Capillary Refill: Capillary refill takes less than 2 seconds.  Neurological:     General: No focal deficit present.     Mental Status: She is alert and oriented to person, place, and time.  Psychiatric:        Mood and Affect: Mood normal.        Behavior: Behavior normal.        09/02/2024    9:08 AM 06/03/2024   10:18 AM 03/02/2024    9:05 AM  Depression screen PHQ 2/9  Decreased Interest 0 0 0  Down, Depressed, Hopeless 0 0   PHQ - 2 Score 0 0 0  Altered sleeping   1  Change in appetite   0  Feeling bad or failure about yourself    0  Trouble concentrating   0  Moving slowly or fidgety/restless   0  Suicidal thoughts   0  PHQ-9 Score   1   Difficult doing work/chores   Not difficult at all     Data  saved with a previous flowsheet row definition       09/02/2024    9:08 AM 03/02/2024    9:05 AM 08/27/2023    9:58 AM 08/11/2023    8:39 AM  GAD 7 : Generalized Anxiety Score  Nervous, Anxious, on Edge 0 0 1 0  Control/stop worrying 0 0 0 0  Worry too much - different things 0 0 1 0  Trouble relaxing 0 0 0 0  Restless 0 0 0 0  Easily annoyed or irritable 0 0 0 0  Afraid - awful might happen 0 0 0 0  Total GAD 7 Score 0 0 2 0    No results found for any visits on 10/11/24.    The 10-year ASCVD risk score (Arnett DK, et al., 2019) is: 11.3%    Assessment & Plan:  Perichondritis of left ear Assessment & Plan: Swelling of the tragus for the past month. No fluctuance to indicated drainage. Treat with cipro . Recommended follow up with PCP if symptoms are not improving.    Other orders -     Ciprofloxacin  HCl; Take 1 tablet (500 mg total) by mouth 2 (two) times daily for 10 days.  Dispense: 20 tablet; Refill: 0     No follow-ups on file.    Harlene Saddler, MD "

## 2024-10-11 NOTE — Assessment & Plan Note (Signed)
 Swelling of the tragus for the past month. No fluctuance to indicated drainage. Treat with cipro . Recommended follow up with PCP if symptoms are not improving.

## 2024-10-19 ENCOUNTER — Ambulatory Visit: Payer: Self-pay

## 2024-10-19 NOTE — Telephone Encounter (Signed)
 I tried calling the patient twice and I did not reach her. I left a voicemail.  I reviewed the chart and the note from 12/22 visit with Dr Lemon. I agree with the treatment plan and it seems that this is a more subacute on chronic ear cartilage issue from prior piercing. It seems to not be a sudden new problem.  I am not sure that I have a simple solution on the phone or remotely at this time without further evaluation of the ear or discussion with patient.  She should have a few more doses of Cipro  antibiotic. She may warrant ENT consult in near future if it does not resolve. There is a chance it could require a minor drainage or procedure. But again I would need to evaluate the ear first.  If she is leaving tomorrow and then we are closed for New Years Day and I am gone 10/22/24 I am not sure how else to solve this issue right now.  I would advise her to keep following Dr Maximiano instructions, finish the cipro , and seek care sooner at Urgent Care while traveling if severe worsening symptoms fever chills redness drainage of pus or spreading infection or severe pain.  We can try to schedule her for when she returns if there is an opening. Otherwise, if she would rather skip to the ENT I can place a referral.  Marsa Officer, DO [facility] 10/19/2024, 5:26 PM

## 2024-10-19 NOTE — Telephone Encounter (Signed)
 Patient seen on 10/11/2024 by Lemon Raisin MD and prescribed Ciprofloxacin  500mg  1 tablet two times daily for ten days and diagnosed with perichondritis of the left ear. Patient states this ear is not better and she was advised to follow up with her PCP if it didn't get better. No openings at PCP office today and patient is leaving early tomorrow morning to go out of town and wont be back until Monday.  She states she might go to Urgent Care or call back Monday.  FYI Only or Action Required?: Action required by provider: request for appointment, clinical question for provider, and update on patient condition.  Patient was last seen in primary care on 10/11/2024 by Lemon Raisin, MD.  Called Nurse Triage reporting Ear Problem.  Symptoms began x 1 month.  Interventions attempted: Prescription medications: Ciprofloxacin  500mg  1 tablet by mouth two times daily for ten days and Rest, hydration, or home remedies.  Symptoms are: unchanged.  Triage Disposition: See PCP When Office is Open (Within 3 Days)  Patient/caregiver understands and will follow disposition?: Unsure    Reason for Disposition  [1] Minor pierced ear infection (e.g., localized redness just at earring site, minor discharge) AND [2] not improved > 3 days following Care Advice including antibiotic ointment  Answer Assessment - Initial Assessment Questions Patient was seen on 10/11/2024 and prescribed Cirprofloxacin (Cipro ) 500mg  and patient states she has enough for two more days.  Patient states her ear feels swollen and not better. She denies any pain, fever. She states it feels like it is slightly hard to hear out of this ear at times due to the swelling. She denies any other new symptoms. Patient states that she is going out of town tomorrow morning and won't be back until Monday. There are no open appointments today at patient's PCP or at a surrounding office within the region Patient was offered an appointment tomorrow  morning at 9:40am at [facility] but states she is leaving early tomorrow to go out of town and states she will wait until Monday and call back. She is advised that it is recommended to have this evaluated within the next few days and Urgent Care is recommended as an alternative. She states that she may go to the Urgent Care. Patient is advised to call us  back if anything changes or with any further questions/concerns. Patient is advised that if anything worsens to go to the Emergency Room. Patient verbalized understanding.    4. PIERCING LOCATION: Where is the piercing? (e.g., ear lobe, upper ear)      Tragus 5. PIERCING DATE: When did you get your piercing?     A month ago  Protocols used: Ear Piercing Symptoms and Questions-A-AH

## 2024-10-19 NOTE — Telephone Encounter (Signed)
 Attempted to return call but NA, LM for pt to return call to office. Attempt #1   Copied from CRM #[id number]. Topic: Clinical - Red Word Triage >> Oct 19, 2024 10:20 AM Victoria B wrote: [facility] that prompted transfer to Nurse Triage: patient left ear is swollen closing on the inside >> Oct 19, 2024 10:28 AM Victoria B wrote: Patient couldn't hold anymore. She is requesting cb

## 2024-10-25 ENCOUNTER — Ambulatory Visit: Admitting: Family Medicine

## 2024-10-25 ENCOUNTER — Encounter: Payer: Self-pay | Admitting: Family Medicine

## 2024-10-25 VITALS — BP 120/72 | HR 76 | Ht 66.5 in | Wt 167.5 lb

## 2024-10-25 DIAGNOSIS — D2322 Other benign neoplasm of skin of left ear and external auricular canal: Secondary | ICD-10-CM

## 2024-10-25 DIAGNOSIS — S01332A Puncture wound without foreign body of left ear, initial encounter: Secondary | ICD-10-CM

## 2024-10-25 DIAGNOSIS — T148XXA Other injury of unspecified body region, initial encounter: Secondary | ICD-10-CM

## 2024-10-25 NOTE — Patient Instructions (Addendum)
 Thank you for coming to the office today.  Referral to return to Dermatologist for ear cyst / seroma excision or removal   [facility] Dermatologist in [place], [place]  Address: [address], [place], [place] [address] Phone: [phone]  Please schedule a Follow-up Appointment to: Return if symptoms worsen or fail to improve.  If you have any other questions or concerns, please feel free to call the office or send a message through MyChart. You may also schedule an earlier appointment if necessary.  Additionally, you may be receiving a survey about your experience at our office within a few days to 1 week by e-mail or mail. We value your feedback.  Marsa Officer, DO [facility], [place]

## 2024-10-25 NOTE — Progress Notes (Signed)
 "  Subjective:    Patient ID: Brenda Caldwell, female    DOB: 1951-03-20, 74 y.o.   MRN: [id number]  Brenda Caldwell is a 74 y.o. female presenting on 10/25/2024 for Ear Pain   HPI  Discussed the use of AI scribe software for clinical note transcription with the patient, who gave verbal consent to proceed.  History of Present Illness   Brenda Caldwell is a 74 year old female who presents with persistent swelling of the left tragus following a piercing.  Left tragus swelling / nodule vs seroma vs cyst complication of left ear piercing  Follow-up after recent visit at other office on 12/22 treated for perichondritis of left ear, completed oral cipro  course  - Persistent swelling of the left tragus since piercing performed several months ago 6+ month - Swelling persists despite removal of earring weeks ago - Swelling is most pronounced overnight, causing the area to nearly close up - Swelling is firm with occasional crusting, especially in the morning - Minimal pain at present, rated as 1 out of 10 after earring removal - Area feels like a nodule or cyst rather than fluid or pus - Attempts to squeeze the area have not produced drainage  Cutaneous history - History of squamous cell carcinoma - Regular dermatology follow-up every six months - Dr Cathlyn in [facility]  Imbalance - Slight imbalance possibly related to ear swelling           09/02/2024    9:08 AM 06/03/2024   10:18 AM 03/02/2024    9:05 AM  Depression screen PHQ 2/9  Decreased Interest 0 0 0  Down, Depressed, Hopeless 0 0   PHQ - 2 Score 0 0 0  Altered sleeping   1  Change in appetite   0  Feeling bad or failure about yourself    0  Trouble concentrating   0  Moving slowly or fidgety/restless   0  Suicidal thoughts   0  PHQ-9 Score   1   Difficult doing work/chores   Not difficult at all     Data saved with a previous flowsheet row definition       09/02/2024    9:08 AM 03/02/2024    9:05 AM 08/27/2023     9:58 AM 08/11/2023    8:39 AM  GAD 7 : Generalized Anxiety Score  Nervous, Anxious, on Edge 0 0 1 0  Control/stop worrying 0 0 0 0  Worry too much - different things 0 0 1 0  Trouble relaxing 0 0 0 0  Restless 0 0 0 0  Easily annoyed or irritable 0 0 0 0  Afraid - awful might happen 0 0 0 0  Total GAD 7 Score 0 0 2 0    Social History[1]  Review of Systems Per HPI unless specifically indicated above     Objective:    BP 120/72 (BP Location: Right Arm, Patient Position: Sitting, Cuff Size: Normal)   Pulse 76   Ht 5' 6.5 (1.689 m)   Wt 167 lb 8 oz (76 kg)   SpO2 98%   BMI 26.63 kg/m   Wt Readings from Last 3 Encounters:  10/25/24 167 lb 8 oz (76 kg)  09/29/24 165 lb 12.8 oz (75.2 kg)  09/02/24 169 lb 6 oz (76.8 kg)    Physical Exam Vitals and nursing note reviewed.  Constitutional:      General: She is not in acute distress.    Appearance: Normal appearance.  She is well-developed. She is not diaphoretic.     Comments: Well-appearing, comfortable, cooperative  HENT:     Head: Normocephalic and atraumatic.  Eyes:     General:        Right eye: No discharge.        Left eye: No discharge.     Conjunctiva/sclera: Conjunctivae normal.  Cardiovascular:     Rate and Rhythm: Normal rate.  Pulmonary:     Effort: Pulmonary effort is normal.  Skin:    General: Skin is warm and dry.     Findings: Lesion (Left ear tragus with soft tissue swollen nodular density partially obstructing external ear canal, no obvious erythema or drainage or fluctuance) present. No erythema or rash.  Neurological:     Mental Status: She is alert and oriented to person, place, and time.  Psychiatric:        Mood and Affect: Mood normal.        Behavior: Behavior normal.        Thought Content: Thought content normal.     Comments: Well groomed, good eye contact, normal speech and thoughts     Left Ear Cyst vs Seroma of Tragus   Results for orders placed or performed during the hospital  encounter of 09/29/24  Surgical pathology   Collection Time: 09/29/24 12:00 AM  Result Value Ref Range   SURGICAL PATHOLOGY      SURGICAL PATHOLOGY [facility] [address], [address] [place], [place] [address] Telephone [phone] or [phone] Fax [phone]  REPORT OF SURGICAL PATHOLOGY   Accession #: [id number] Patient Name: Brenda Caldwell, Brenda Caldwell Visit # : [id number]  MRN: [id number] Physician: Aundria Lombard DOB/Age [birth year] (Age: 35) Gender: F Collected Date: 09/29/2024 Received Date: 09/29/2024  FINAL DIAGNOSIS       1. Descending Colon Polyp, cbx :       HYPERPLASTIC POLYP      NEGATIVE FOR DYSPLASIA AND CARCINOMA       DATE SIGNED OUT: 09/30/2024 ELECTRONIC SIGNATURE : Picklesimer Md, Fred , Sports Administrator, Electronic Signature  MICROSCOPIC DESCRIPTION  CASE COMMENTS STAINS USED IN DIAGNOSIS: H&E    CLINICAL HISTORY  SPECIMEN(S) OBTAINED 1. Descending Colon Polyp, Cbx  SPECIMEN COMMENTS: SPECIMEN CLINICAL INFORMATION: 1. Colon polyps    Gross Description 1. Received in formalin is a tan, soft tissue fragment that is submitted in t oto.  Size:  0.3 cm, 1 block submitted. mb 09-29-24        Report signed out from the following location(s) [place] [address] [place], [place] [address] CLIA #: [id number]  [facility] [address] [place], [place] [address] CLIA #: [id number]       Assessment & Plan:   Problem List Items Addressed This Visit   None Visit Diagnoses       Dermoid cyst of left ear    -  Primary   Relevant Orders   Ambulatory referral to Dermatology     Wound seroma       Relevant Orders   Ambulatory referral to Dermatology     Complication of left ear piercing, initial encounter       Relevant Orders   Ambulatory referral to Dermatology        Left ear cyst or seroma vs cyst after piercing Persistent firm swelling nodular density in left  tragus, likely cyst or seroma, causing mild hearing and pressure issues. No sign of abscess or bacterial infection.  Has already completed antibiotics.  - Referred to dermatologist for evaluation and potential excision. Already followed by [facility] Dermatology Dr Cathlyn, but this is a new problem. - Sent referral with photo and chart details to dermatologist. - Advised against self-drainage or use of peroxide.      Orders Placed This Encounter  Procedures   Ambulatory referral to Dermatology    Referral Priority:   Routine    Referral Type:   Consultation    Referral Reason:   Specialty Services Required    Requested Specialty:   Dermatology    Number of Visits Requested:   1    No orders of the defined types were placed in this encounter.   Follow up plan: Return if symptoms worsen or fail to improve.   Marsa Officer, DO [facility] 10/25/2024, 11:39 AM     [1]  Social History Tobacco Use   Smoking status: Never   Smokeless tobacco: Never  Vaping Use   Vaping status: Never Used  Substance Use Topics   Alcohol use: Yes    Alcohol/week: 1.0 standard drink of alcohol    Types: 1 Glasses of wine per week    Comment: 1 glass 3-4 nights per week   Drug use: Never   "

## 2024-11-09 ENCOUNTER — Other Ambulatory Visit: Payer: Self-pay | Admitting: Family Medicine

## 2024-11-09 DIAGNOSIS — I10 Essential (primary) hypertension: Secondary | ICD-10-CM

## 2024-11-09 NOTE — Telephone Encounter (Signed)
 Requested medications are due for refill today.  yes  Requested medications are on the active medications list.  yes  Last refill. 03/02/2024 #90 1 rf  Future visit scheduled.   yes  Notes to clinic.  Expired labs    Requested Prescriptions  Pending Prescriptions Disp Refills   valsartan  (DIOVAN ) 320 MG tablet [Pharmacy Med Name: VALSARTAN  320 MG TABLET] 90 tablet 0    Sig: Take 1 tablet (320 mg total) by mouth daily.     Cardiovascular:  Angiotensin Receptor Blockers Failed - 11/09/2024  5:51 PM      Failed - Cr in normal range and within 180 days    Creat  Date Value Ref Range Status  02/24/2024 0.70 0.60 - 1.00 mg/dL Final         Failed - K in normal range and within 180 days    Potassium  Date Value Ref Range Status  02/24/2024 4.7 3.5 - 5.3 mmol/L Final         Passed - Patient is not pregnant      Passed - Last BP in normal range    BP Readings from Last 1 Encounters:  10/25/24 120/72         Passed - Valid encounter within last 6 months    Recent Outpatient Visits           2 weeks ago Dermoid cyst of left ear   [place] [facility] [place] Marsa PARAS, DO   4 weeks ago Perichondritis of left ear   [place] [facility], MD   2 months ago Pre-diabetes   [place] [facility] Marsa PARAS, DO   4 months ago Anxiety state   [place] [facility] Marsa PARAS, DO   8 months ago Annual physical exam   [facility] [place], Marsa PARAS, [place]

## 2024-11-16 ENCOUNTER — Telehealth: Payer: Self-pay

## 2024-11-16 NOTE — Telephone Encounter (Signed)
 I have not received any paperwork.  It sounds like from this message that she just needs permission from me to be cleared to participate. She will likely sign the waiver at their facility when she arrives.  I attempted to call her and did not reach her. I left a detailed voicemail stating that I reviewed the request and will give her my verbal permission to participate in the epsom salt float therapy. I reviewed her BP medication and current therapy and that she is cleared to participate.  I stated in my message that if she just needed verbal then she should be all set. If she needs anything handwritten or signed, we can work on it and have it ready for pick up or fax etc.  Marsa Officer, DO [facility] 11/16/2024, 10:45 AM

## 2024-11-16 NOTE — Telephone Encounter (Signed)
 Copied from CRM #[id number]. Topic: Clinical - Medical Advice >> Nov 15, 2024 12:22 PM Lonell PEDLAR wrote: Reason for CRM: Patient is scheduled to have float therapy tomorrow, yet requires clearance from PCP, given that she takes BP medication. She must sign a waiver that she spoke with PCP for clearance.   Float therapy is essentially a big tank of water with epsom salt that pt can float in. Name of facility is [facility] float is [place]   Please review and contact pt to advise. [phone]

## 2024-11-25 ENCOUNTER — Ambulatory Visit: Admitting: Cardiology

## 2024-11-25 ENCOUNTER — Encounter: Payer: Self-pay | Admitting: Cardiology

## 2024-11-25 VITALS — BP 118/80 | HR 64 | Ht 67.0 in | Wt 167.8 lb

## 2024-11-25 DIAGNOSIS — I77810 Thoracic aortic ectasia: Secondary | ICD-10-CM

## 2024-11-25 DIAGNOSIS — I10 Essential (primary) hypertension: Secondary | ICD-10-CM

## 2024-11-25 NOTE — Progress Notes (Signed)
 " Cardiology Office Note:    Date:  11/25/2024   ID:  Brenda Caldwell, DOB [birth year], MRN [id number]  PCP:  Edman Marsa PARAS, DO   [facility] Providers Cardiologist:  Redell Cave, MD     Referring MD: Edman Marsa PARAS, DO   Chief Complaint  Patient presents with   108 MONTH FOLLOW-UP    Pt doing good.    History of Present Illness:    Brenda Caldwell is a 74 y.o. female with a hx of hypertension, mild ascending aorta dilatation presenting for follow-up.   Patient states feeling well, denies chest pain or shortness of breath, compliant with medications as prescribed.  Denies any new concerns.   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 Carotid ultrasound 08/2023 no stenosis. Cardiac monitor 08/2023 frequent PACs, no sustained arrhythmias, no A-fib or flutter.   Past Medical History:  Diagnosis Date   Allergic disorder    Anxiety    Hypertension     Past Surgical History:  Procedure Laterality Date   bladder tack  2000   COLONOSCOPY N/A 09/29/2024   Procedure: COLONOSCOPY;  Surgeon: Toledo, Ladell POUR, MD;  Location: [facility] ENDOSCOPY;  Service: Gastroenterology;  Laterality: N/A;  EARLY   POLYPECTOMY  09/29/2024   Procedure: POLYPECTOMY, INTESTINE;  Surgeon: Toledo, Ladell POUR, MD;  Location: [facility] ENDOSCOPY;  Service: Gastroenterology;;   TOTAL ABDOMINAL HYSTERECTOMY  2000    Current Medications: Current Meds  Medication Sig   amLODipine  (NORVASC ) 5 MG tablet Take 1 tablet (5 mg total) by mouth daily.   escitalopram  (LEXAPRO ) 10 MG tablet Take 1 tablet (10 mg total) by mouth daily.   fluticasone  (FLONASE ) 50 MCG/ACT nasal spray Place 2 sprays into both nostrils daily.   LORazepam  (ATIVAN ) 0.5 MG tablet Take 1 tablet (0.5 mg total) by mouth every 8 (eight) hours as needed for anxiety.   valsartan  (DIOVAN ) 320 MG tablet Take 1 tablet (320 mg total) by mouth daily.     Allergies:   Patient  has no known allergies.   Social History   Socioeconomic History   Marital status: Divorced    Spouse name: Not on file   Number of children: Not on file   Years of education: Not on file   Highest education level: Bachelor's degree (e.g., BA, AB, BS)  Occupational History   Not on file  Tobacco Use   Smoking status: Never   Smokeless tobacco: Never  Vaping Use   Vaping status: Never Used  Substance and Sexual Activity   Alcohol use: Yes    Alcohol/week: 1.0 standard drink of alcohol    Types: 1 Glasses of wine per week    Comment: 1 glass 3-4 nights per week   Drug use: Never   Sexual activity: Not on file  Other Topics Concern   Not on file  Social History Narrative   Not on file   Social Drivers of Health   Tobacco Use: Low Risk (11/25/2024)   Patient History    Smoking Tobacco Use: Never    Smokeless Tobacco Use: Never    Passive Exposure: Not on file  Financial Resource Strain: Low Risk (06/03/2024)   Overall Financial Resource Strain (CARDIA)    Difficulty of Paying Living Expenses: Not hard at all  Food Insecurity: No Food Insecurity (06/03/2024)   Epic    Worried About Radiation Protection Practitioner of Food in the Last Year: Never true    Ran Out of  Food in the Last Year: Never true  Transportation Needs: No Transportation Needs (06/03/2024)   Epic    Lack of Transportation (Medical): No    Lack of Transportation (Non-Medical): No  Physical Activity: Sufficiently Active (06/03/2024)   Exercise Vital Sign    Days of Exercise per Week: 4 days    Minutes of Exercise per Session: 50 min  Stress: No Stress Concern Present (06/03/2024)   [employer] of Occupational Health - Occupational Stress Questionnaire    Feeling of Stress: Not at all  Recent Concern: Stress - Stress Concern Present (06/02/2024)   [employer] of Occupational Health - Occupational Stress Questionnaire    Feeling of Stress: To some extent  Social Connections: Socially Isolated (06/03/2024)   Social  Connection and Isolation Panel    Frequency of Communication with Friends and Family: More than three times a week    Frequency of Social Gatherings with Friends and Family: More than three times a week    Attends Religious Services: Never    Database Administrator or Organizations: No    Attends Banker Meetings: Never    Marital Status: Widowed  Depression (PHQ2-9): Low Risk (09/02/2024)   Depression (PHQ2-9)    PHQ-2 Score: 0  Alcohol Screen: Low Risk (06/03/2024)   Alcohol Screen    Last Alcohol Screening Score (AUDIT): 4  Housing: Unknown (09/13/2024)   Received from [facility]   Epic    Unable to Pay for Housing in the Last Year: Not on file    Number of Times Moved in the Last Year: Not on file    At any time in the past 12 months, were you homeless or living in a shelter (including now)?: No  Utilities: Not At Risk (06/03/2024)   Epic    Threatened with loss of utilities: No  Health Literacy: Adequate Health Literacy (06/03/2024)   B1300 Health Literacy    Frequency of need for help with medical instructions: Never     Family History: The patient's family history includes Bipolar disorder in her brother; Depression in her mother; Heart disease in her maternal grandmother; Lung cancer in her mother; Stomach cancer in her father.  ROS:   Please see the history of present illness.     All other systems reviewed and are negative.  EKGs/Labs/Other Studies Reviewed:    The following studies were reviewed today:  EKG Interpretation Date/Time:  Thursday November 25 2024 11:49:27 EST Ventricular Rate:  64 PR Interval:  146 QRS Duration:  76 QT Interval:  422 QTC Calculation: 435 R Axis:   26  Text Interpretation: Normal sinus rhythm Normal ECG Confirmed by Darliss Rogue ([id number]) on 11/25/2024 11:57:50 AM    Recent Labs: 02/24/2024: ALT 17; BUN 21; Creat 0.70; Hemoglobin 12.9; Platelets 266; Potassium 4.7; Sodium 137; TSH 2.10  Recent  Lipid Panel    Component Value Date/Time   CHOL 203 (H) 02/24/2024 0815   TRIG 120 02/24/2024 0815   HDL 70 02/24/2024 0815   CHOLHDL 2.9 02/24/2024 0815   LDLCALC 110 (H) 02/24/2024 0815    Risk Assessment/Calculations:              Physical Exam:    VS:  BP 118/80 (BP Location: Left Arm, Patient Position: Sitting, Cuff Size: Normal)   Pulse 64 Comment: 70 oximeter  Ht 5' 7 (1.702 m)   Wt 167 lb 12.8 oz (76.1 kg)   SpO2 95%   BMI 26.28 kg/m  Wt Readings from Last 3 Encounters:  11/25/24 167 lb 12.8 oz (76.1 kg)  10/25/24 167 lb 8 oz (76 kg)  09/29/24 165 lb 12.8 oz (75.2 kg)     GEN:  Well nourished, well developed in no acute distress HEENT: Normal NECK: No JVD; No carotid bruits CARDIAC: RRR, no murmurs, rubs, gallops RESPIRATORY:  Clear to auscultation without rales, wheezing or rhonchi  MUSCULOSKELETAL:  1+ edema;  SKIN: Warm and dry NEUROLOGIC:  Alert and oriented x 3 PSYCHIATRIC:  Normal affect   ASSESSMENT:    1. Mild ascending aorta dilatation   2. Primary hypertension    PLAN:    In order of problems listed above:  Mild ascending aorta dilatation, echo 11/24 EF 55 to 60%, mild ascending aorta dilatation measuring 40 mm.  Monitor serially with repeat echoes, repeat echo in 3 to 4 years. Hypertension, BP controlled.  Continue Norvasc  5 mg daily, valsartan  320 mg daily.  Follow-up in 12 months      Medication Adjustments/Labs and Tests Ordered: Current medicines are reviewed at length with the patient today.  Concerns regarding medicines are outlined above.  Orders Placed This Encounter  Procedures   EKG 12-Lead   ECHOCARDIOGRAM COMPLETE   No orders of the defined types were placed in this encounter.   Patient Instructions  Medication Instructions:  Your physician recommends that you continue on your current medications as directed. Please refer to the Current Medication list given to you today.   *If you need a refill on your cardiac  medications before your next appointment, please call your pharmacy*  Lab Work: No labs ordered today  If you have labs (blood work) drawn today and your tests are completely normal, you will receive your results only by: MyChart Message (if you have MyChart) OR A paper copy in the mail If you have any lab test that is abnormal or we need to change your treatment, we will call you to review the results.  Testing/Procedures: Your physician has requested that you have an echocardiogram in 1 year. Echocardiography is a painless test that uses sound waves to create images of your heart. It provides your doctor with information about the size and shape of your heart and how well your hearts chambers and valves are working.   You may receive an ultrasound enhancing agent through an IV if needed to better visualize your heart during the echo. This procedure takes approximately one hour.  There are no restrictions for this procedure.  This will take place at [address] [facility] [address]) [address], [place] [address]  Please note: We ask at that you not bring children with you during ultrasound (echo/ vascular) testing. Due to room size and safety concerns, children are not allowed in the ultrasound rooms during exams. Our front office staff cannot provide observation of children in our lobby area while testing is being conducted. An adult accompanying a patient to their appointment will only be allowed in the ultrasound room at the discretion of the ultrasound technician under special circumstances. We apologize for any inconvenience.   Follow-Up: At [facility], you and your health needs are our priority.  As part of our continuing mission to provide you with exceptional heart care, our providers are all part of one team.  This team includes your primary Cardiologist (physician) and Advanced Practice Providers or APPs (Physician Assistants and Nurse Practitioners) who all work  together to provide you with the care you need, when you need  it.  Your next appointment:   1 year(s)  Provider:   You may see Redell Cave, MD or one of the following Advanced Practice Providers on your designated Care Team:   Lonni Meager, NP Lesley Maffucci, PA-C Bernardino Bring, PA-C Cadence [place], PA-C Tylene Lunch, NP Barnie Hila, NP    We recommend signing up for the patient portal called MyChart.  Sign up information is provided on this After Visit Summary.  MyChart is used to connect with patients for Virtual Visits (Telemedicine).  Patients are able to view lab/test results, encounter notes, upcoming appointments, etc.  Non-urgent messages can be sent to your provider as well.   To learn more about what you can do with MyChart, go to [email or web address].               Signed, Redell Cave, MD  11/25/2024 12:32 PM    [facility] "

## 2024-11-25 NOTE — Patient Instructions (Signed)
 Medication Instructions:  Your physician recommends that you continue on your current medications as directed. Please refer to the Current Medication list given to you today.   *If you need a refill on your cardiac medications before your next appointment, please call your pharmacy*  Lab Work: No labs ordered today  If you have labs (blood work) drawn today and your tests are completely normal, you will receive your results only by: MyChart Message (if you have MyChart) OR A paper copy in the mail If you have any lab test that is abnormal or we need to change your treatment, we will call you to review the results.  Testing/Procedures: Your physician has requested that you have an echocardiogram in 1 year. Echocardiography is a painless test that uses sound waves to create images of your heart. It provides your doctor with information about the size and shape of your heart and how well your heart's chambers and valves are working.   You may receive an ultrasound enhancing agent through an IV if needed to better visualize your heart during the echo. This procedure takes approximately one hour.  There are no restrictions for this procedure.  This will take place at [address] [facility] [address]) [address], [place] [address]  Please note: We ask at that you not bring children with you during ultrasound (echo/ vascular) testing. Due to room size and safety concerns, children are not allowed in the ultrasound rooms during exams. Our front office staff cannot provide observation of children in our lobby area while testing is being conducted. An adult accompanying a patient to their appointment will only be allowed in the ultrasound room at the discretion of the ultrasound technician under special circumstances. We apologize for any inconvenience.   Follow-Up: At [facility], you and your health needs are our priority.  As part of our continuing mission to provide you with exceptional  heart care, our providers are all part of one team.  This team includes your primary Cardiologist (physician) and Advanced Practice Providers or APPs (Physician Assistants and Nurse Practitioners) who all work together to provide you with the care you need, when you need it.  Your next appointment:   1 year(s)  Provider:   You may see Constancia Delton, MD or one of the following Advanced Practice Providers on your designated Care Team:   Laneta Pintos, NP Gildardo Labrador, PA-C Varney Gentleman, PA-C Cadence [place], PA-C Ronald Cockayne, NP Morey Ar, NP    We recommend signing up for the patient portal called MyChart.  Sign up information is provided on this After Visit Summary.  MyChart is used to connect with patients for Virtual Visits (Telemedicine).  Patients are able to view lab/test results, encounter notes, upcoming appointments, etc.  Non-urgent messages can be sent to your provider as well.   To learn more about what you can do with MyChart, go to [email or web address].

## 2024-12-20 ENCOUNTER — Ambulatory Visit: Admitting: Physical Therapy

## 2024-12-22 ENCOUNTER — Ambulatory Visit: Admitting: Physical Therapy

## 2024-12-27 ENCOUNTER — Ambulatory Visit: Admitting: Physical Therapy

## 2024-12-29 ENCOUNTER — Ambulatory Visit: Admitting: Physical Therapy

## 2025-01-03 ENCOUNTER — Ambulatory Visit: Admitting: Physical Therapy

## 2025-01-05 ENCOUNTER — Ambulatory Visit: Admitting: Physical Therapy

## 2025-01-10 ENCOUNTER — Ambulatory Visit: Admitting: Physical Therapy

## 2025-01-12 ENCOUNTER — Ambulatory Visit: Admitting: Physical Therapy

## 2025-01-17 ENCOUNTER — Ambulatory Visit: Admitting: Physical Therapy

## 2025-01-19 ENCOUNTER — Ambulatory Visit: Admitting: Physical Therapy

## 2025-01-24 ENCOUNTER — Ambulatory Visit: Admitting: Physical Therapy

## 2025-01-26 ENCOUNTER — Ambulatory Visit: Admitting: Physical Therapy

## 2025-02-23 ENCOUNTER — Other Ambulatory Visit

## 2025-03-02 ENCOUNTER — Encounter: Admitting: Family Medicine

## 2025-06-17 ENCOUNTER — Ambulatory Visit

## 2025-06-22 ENCOUNTER — Ambulatory Visit
